# Patient Record
Sex: Male | Born: 1970 | Race: White | Hispanic: No | Marital: Married | Smoking: Current every day smoker
Health system: Southern US, Community
[De-identification: ages and names within clinical notes are randomized; demographics above are authoritative.]

## PROBLEM LIST (undated history)

## (undated) DIAGNOSIS — G4730 Sleep apnea, unspecified: Secondary | ICD-10-CM

## (undated) DIAGNOSIS — F32A Depression, unspecified: Secondary | ICD-10-CM

## (undated) DIAGNOSIS — K625 Hemorrhage of anus and rectum: Secondary | ICD-10-CM

## (undated) DIAGNOSIS — F419 Anxiety disorder, unspecified: Secondary | ICD-10-CM

## (undated) DIAGNOSIS — G8929 Other chronic pain: Secondary | ICD-10-CM

## (undated) DIAGNOSIS — K219 Gastro-esophageal reflux disease without esophagitis: Secondary | ICD-10-CM

## (undated) HISTORY — DX: Hemorrhage of anus and rectum: K62.5

## (undated) HISTORY — PX: PROSTATE BIOPSY: SHX241

---

## 2010-07-22 ENCOUNTER — Other Ambulatory Visit (HOSPITAL_COMMUNITY): Payer: Self-pay | Admitting: Neurosurgery

## 2010-07-22 DIAGNOSIS — M542 Cervicalgia: Secondary | ICD-10-CM

## 2010-07-22 DIAGNOSIS — M549 Dorsalgia, unspecified: Secondary | ICD-10-CM

## 2010-07-29 ENCOUNTER — Ambulatory Visit (HOSPITAL_COMMUNITY)
Admission: RE | Admit: 2010-07-29 | Discharge: 2010-07-29 | Disposition: A | Discharge status: Home / Self Care | Payer: BC Managed Care – PPO | Source: Ambulatory Visit | Attending: Neurosurgery | Admitting: Neurosurgery

## 2010-07-29 ENCOUNTER — Ambulatory Visit (HOSPITAL_COMMUNITY): Payer: Self-pay

## 2010-07-29 DIAGNOSIS — M79609 Pain in unspecified limb: Secondary | ICD-10-CM | POA: Insufficient documentation

## 2010-07-29 DIAGNOSIS — M545 Low back pain, unspecified: Secondary | ICD-10-CM | POA: Insufficient documentation

## 2010-07-29 DIAGNOSIS — M542 Cervicalgia: Secondary | ICD-10-CM | POA: Insufficient documentation

## 2010-07-29 DIAGNOSIS — M5126 Other intervertebral disc displacement, lumbar region: Secondary | ICD-10-CM | POA: Insufficient documentation

## 2010-07-29 DIAGNOSIS — M5020 Other cervical disc displacement, unspecified cervical region: Secondary | ICD-10-CM | POA: Insufficient documentation

## 2010-07-29 DIAGNOSIS — M549 Dorsalgia, unspecified: Secondary | ICD-10-CM

## 2010-10-18 ENCOUNTER — Emergency Department (HOSPITAL_COMMUNITY)
Admission: EM | Admit: 2010-10-18 | Discharge: 2010-10-18 | Disposition: A | Discharge status: Other Acute Inpatient Hospital | Payer: BC Managed Care – PPO | Attending: Emergency Medicine | Admitting: Emergency Medicine

## 2010-10-18 ENCOUNTER — Inpatient Hospital Stay (HOSPITAL_COMMUNITY)
Admission: RE | Admit: 2010-10-18 | Discharge: 2010-10-21 | DRG: 745 | Disposition: A | Discharge status: Home / Self Care | Payer: BC Managed Care – PPO | Source: Ambulatory Visit | Attending: Psychiatry | Admitting: Psychiatry

## 2010-10-18 DIAGNOSIS — G8929 Other chronic pain: Secondary | ICD-10-CM

## 2010-10-18 DIAGNOSIS — M25559 Pain in unspecified hip: Secondary | ICD-10-CM

## 2010-10-18 DIAGNOSIS — F1120 Opioid dependence, uncomplicated: Principal | ICD-10-CM

## 2010-10-18 DIAGNOSIS — F1110 Opioid abuse, uncomplicated: Secondary | ICD-10-CM | POA: Insufficient documentation

## 2010-10-18 DIAGNOSIS — F17200 Nicotine dependence, unspecified, uncomplicated: Secondary | ICD-10-CM

## 2010-10-18 DIAGNOSIS — Z6379 Other stressful life events affecting family and household: Secondary | ICD-10-CM

## 2010-10-18 DIAGNOSIS — M549 Dorsalgia, unspecified: Secondary | ICD-10-CM

## 2010-10-18 LAB — HEPATIC FUNCTION PANEL
ALT: 22 U/L (ref 0–53)
AST: 21 U/L (ref 0–37)
Alkaline Phosphatase: 72 U/L (ref 39–117)
Bilirubin, Direct: <0.1 mg/dL (ref 0.0–0.3)

## 2010-10-18 LAB — CBC
HCT: 41 % (ref 39.0–52.0)
MCV: 95.1 fL (ref 78.0–100.0)
RBC: 4.31 MIL/uL (ref 4.22–5.81)
WBC: 5.8 10*3/uL (ref 4.0–10.5)

## 2010-10-18 LAB — BASIC METABOLIC PANEL
Calcium: 9 mg/dL (ref 8.4–10.5)
GFR calc Af Amer: >60 mL/min (ref >60)
GFR calc non Af Amer: >60 mL/min (ref >60)
Glucose, Bld: 146 mg/dL — ABNORMAL HIGH (ref 70–99)
Potassium: 4 mEq/L (ref 3.5–5.1)
Sodium: 136 mEq/L (ref 135–145)

## 2010-10-18 LAB — ETHANOL: Alcohol, Ethyl (B): <5 mg/dL (ref 0–10)

## 2010-10-18 LAB — RAPID URINE DRUG SCREEN, HOSP PERFORMED
Barbiturates: NOT DETECTED
Benzodiazepines: NOT DETECTED
Cocaine: NOT DETECTED
Opiates: POSITIVE — AB

## 2010-10-18 LAB — DIFFERENTIAL
Lymphocytes Relative: 20 % (ref 12–46)
Lymphs Abs: 1.2 10*3/uL (ref 0.7–4.0)
Neutro Abs: 4 10*3/uL (ref 1.7–7.7)
Neutrophils Relative %: 69 % (ref 43–77)

## 2010-10-19 LAB — GLUCOSE, CAPILLARY: Glucose-Capillary: 122 mg/dL — ABNORMAL HIGH (ref 70–99)

## 2010-10-20 DIAGNOSIS — F1120 Opioid dependence, uncomplicated: Secondary | ICD-10-CM

## 2010-10-20 LAB — GLUCOSE, CAPILLARY
Glucose-Capillary: 131 mg/dL — ABNORMAL HIGH (ref 70–99)
Glucose-Capillary: 99 mg/dL (ref 70–99)

## 2010-10-21 LAB — GLUCOSE, CAPILLARY: Glucose-Capillary: 99 mg/dL (ref 70–99)

## 2010-10-23 NOTE — Discharge Summary (Signed)
  NAMEGILL, Francisco NO.:  [id number]  MEDICAL RECORD NO.:  [id number]           PATIENT TYPE:  I  LOCATION:  0305                          FACILITY:  [facility]  PHYSICIAN:  Anselm Jungling, MD  DATE OF BIRTH:  10-01-1970  DATE OF ADMISSION:  10/18/2010 DATE OF DISCHARGE:  10/21/2010                              DISCHARGE SUMMARY   IDENTIFYING DATA AND REASON FOR ADMISSION:  This was an inpatient psychiatric admission for Francisco [name], a 40 year old married male, who was admitted for treatment of opiate dependence.  Please refer to the admission note for further details pertaining to the symptoms, circumstances and history that led to his hospitalization.  He was given an initial Axis I diagnosis of opiate dependence.  MEDICAL AND LABORATORY:  The patient was medically and physically assessed by the psychiatric nurse practitioner.  His urine drug screen was positive for opiates.  His liver function profile was within normal limits.  He complained of chronic back and hip pain.  He was placed on a trial of Voltaren EC 75 mg b.i.d., Lidoderm 5% patch and Neurontin 600 mg t.i.d. to address these pain symptoms with reasonably good results. There were no other significant medical issues.  HOSPITAL COURSE:  The patient was admitted to the adult inpatient psychiatric service.  He presented as a well-nourished, normally- developed adult male who was generally pleasant and cooperative.  He participated in therapeutic groups and activities including those geared toward 12-step recovery.  He was placed on a clonidine withdrawal protocol.  After the third hospital day, he indicated he did not wish to take clonidine any further, and he requested discharge.  This was the case, even though it had been made clear to him that the detoxification process would be a 4- to 5-day admission.  Nonetheless, he persisted in requesting discharge. He insisted that it was not because of any desire  to return to using opiates.  The suicide risk assessment was completed, and he was rated at minimal risk.  He agreed to the following aftercare plan.  AFTERCARE:  The patient was to follow up at [facility] for their [facility] program.  Also, he was to attend 12-step meetings in the community, and it was recommended that he attend 990 in 90 days.  DISCHARGE MEDICATIONS: 1. Voltaren EC 75 mg b.i.d. 2. Lidoderm 5% patch to back daily. 3. Neurontin 600 mg t.i.d.  DISCHARGE DIAGNOSES:  Axis I:  Opiate dependence. Axis II:  Deferred. Axis III:  Chronic back and hip pain. Axis IV:  Stressors severe. Axis V:  Global Assessment of Functioning on discharge 50.  At the time of discharge, it was explained to the patient that because he had chosen not to stay for the full detoxification process, we would need to reconsider carefully whether we would accept him back for another round of detoxification treatment in the future if it were necessary.     Anselm Jungling, MD     SPB/MEDQ  D:  10/22/2010  T:  10/22/2010  Job:  [id number]  Electronically Signed by Geralyn Flash MD on 10/23/2010 09:03:14 AM
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
## 2011-06-17 ENCOUNTER — Encounter: Payer: Self-pay | Admitting: General Practice

## 2011-06-22 HISTORY — PX: HERNIA REPAIR: SHX51

## 2011-06-28 ENCOUNTER — Ambulatory Visit: Payer: BC Managed Care – PPO | Admitting: Gastroenterology

## 2011-07-01 ENCOUNTER — Ambulatory Visit: Payer: BC Managed Care – PPO | Admitting: Gastroenterology

## 2011-07-15 NOTE — H&P (Signed)
  NTS SOAP Note  Vital Signs:  Vitals as of: 07/08/2011: Systolic 127: Diastolic 84: Heart Rate 79: Temp 98.34F: Height 45ft 9in: Weight 196Lbs 0 Ounces: Pain Level 7: BMI 29  BMI : 28.94 kg/m2  Subjective: This 41 Years 72 Months old Male presents for of  HERNIA: ,Was found to have a right inguinal hernia by urologist.  Has been having right testicular pain over the past month.  Made worse with straining.  Slight bulge noted in right groin region.  Review of Symptoms:  Constitutional:unremarkable Head:unremarkable Eyes:unremarkable Nose/Mouth/Throat:unremarkable Cardiovascular:unremarkable Respiratory:unremarkable Gastrointestinabdominal pain,heartburn right testicular pain back pain Skin:unremarkable Hematolgic/Lymphatic:unremarkable Allergic/Immunologic:unremarkable   Past Medical History:Reviewed   Past Medical History  Surgical History: none Medical Problems: none Allergies: nkda Medications: none   Social History:Reviewed   Social History  Preferred Language: English ([place]) Race:  White Ethnicity: Not Hispanic / Latino Age: 41 Years 8 Months Marital Status:  M Alcohol:  No Recreational drug(s):  No   Smoking Status: Current every day smoker reviewed on 07/08/2011 Started Date: 06/21/1992 Packs per day: 1.00   Family History:Reviewed   Family History  Is there a family history ZO:[id number]    Objective Information: General:Well appearing, well nourished in no distress. Neck:Supple without lymphadenopathy.  Heart:RRR, no murmur or gallop.  Normal S1, S2.  No S3, S4.  Lungs:CTA bilaterally, no wheezes, rhonchi, rales.  Breathing unlabored. Abdomen:Soft, NT/ND, normal bowel sounds, no HSM, no masses.  No peritoneal signs.  Reducible right inguinal hernia noted. XB:[id number]  Assessment:Right inguinal hernia  Diagnosis &amp; Procedure: DiagnosisCode: 550.90,  ProcedureCode: 86578,    Plan:  Will need right inguinal herniorrhaphy.  Patient to call to schedule.   Patient Education:Alternative treatments to surgery were discussed with patient (and family).Risks and benefits  of procedure were fully explained to the patient (and family) who gave informed consent. Patient/family questions were addressed.  Follow-up:Pending Surgery

## 2011-07-26 ENCOUNTER — Encounter (HOSPITAL_COMMUNITY): Payer: Self-pay

## 2011-07-26 ENCOUNTER — Encounter (HOSPITAL_COMMUNITY)
Admission: RE | Admit: 2011-07-26 | Discharge: 2011-07-26 | Disposition: A | Discharge status: Home / Self Care | Payer: BC Managed Care – PPO | Source: Ambulatory Visit | Attending: General Surgery | Admitting: General Surgery

## 2011-07-26 ENCOUNTER — Encounter (HOSPITAL_COMMUNITY): Payer: Self-pay | Admitting: Pharmacy Technician

## 2011-07-26 HISTORY — DX: Gastro-esophageal reflux disease without esophagitis: K21.9

## 2011-07-26 HISTORY — DX: Other chronic pain: G89.29

## 2011-07-26 LAB — CBC
HCT: 40.6 % (ref 39.0–52.0)
Hemoglobin: 13.7 g/dL (ref 13.0–17.0)
MCH: 32.2 pg (ref 26.0–34.0)
MCHC: 33.7 g/dL (ref 30.0–36.0)
MCV: 95.5 fL (ref 78.0–100.0)
Platelets: 269 10*3/uL (ref 150–400)
RBC: 4.25 MIL/uL (ref 4.22–5.81)
RDW: 14.1 % (ref 11.5–15.5)
WBC: 7.1 10*3/uL (ref 4.0–10.5)

## 2011-07-26 LAB — DIFFERENTIAL
Basophils Absolute: 0 10*3/uL (ref 0.0–0.1)
Basophils Relative: 1 % (ref 0–1)
Eosinophils Absolute: 0.3 10*3/uL (ref 0.0–0.7)
Eosinophils Relative: 5 % (ref 0–5)
Lymphocytes Relative: 22 % (ref 12–46)
Lymphs Abs: 1.6 10*3/uL (ref 0.7–4.0)
Monocytes Absolute: 0.5 10*3/uL (ref 0.1–1.0)
Monocytes Relative: 7 % (ref 3–12)
Neutro Abs: 4.7 10*3/uL (ref 1.7–7.7)
Neutrophils Relative %: 66 % (ref 43–77)

## 2011-07-26 LAB — BASIC METABOLIC PANEL
BUN: 11 mg/dL (ref 6–23)
CO2: 25 mEq/L (ref 19–32)
Calcium: 9.8 mg/dL (ref 8.4–10.5)
Chloride: 103 mEq/L (ref 96–112)
Creatinine, Ser: 0.78 mg/dL (ref 0.50–1.35)
GFR calc Af Amer: >90 mL/min (ref >90)
GFR calc non Af Amer: >90 mL/min (ref >90)
Glucose, Bld: 127 mg/dL — ABNORMAL HIGH (ref 70–99)
Potassium: 4.5 mEq/L (ref 3.5–5.1)
Sodium: 137 mEq/L (ref 135–145)

## 2011-07-26 NOTE — Patient Instructions (Addendum)
20 Francisco [name]  07/26/2011   Your procedure is scheduled on:  07/30/2011  [facility] to [facility] at 06:15[facility].  Call this number if you have problems the morning of surgery: [phone]   Remember:   Do not eat food:After Midnight.  May have clear liquids:until Midnight .  Clear liquids include soda, tea, black coffee, apple or grape juice, broth.  Take these medicines the morning of surgery with A SIP OF WATER: Take your Oxycodone only if needed.   Do not wear jewelry, make-up or nail polish.  Do not wear lotions, powders, or perfumes. You may wear deodorant.  Do not shave 48 hours prior to surgery.  Do not bring valuables to the hospital.  Contacts, dentures or bridgework may not be worn into surgery.  Leave suitcase in the car. After surgery it may be brought to your room.  For patients admitted to the hospital, checkout time is 11:00 AM the day of discharge.   Patients discharged the day of surgery will not be allowed to drive home.  Name and phone number of your driver: Family  Special Instructions: CHG Shower Use Special Wash: 1/2 bottle night before surgery and 1/2 bottle morning of surgery.   Please read over the following fact sheets that you were given: Pain Booklet, MRSA Information, Surgical Site Infection Prevention, Anesthesia Post-op Instructions and Care and Recovery After Surgery     Hernia Repair Care After These instructions give you information on caring for yourself after your procedure. Your doctor may also give you more specific instructions. Call your doctor if you have any problems or questions after your procedure. HOME CARE   You may have changes in your poops (bowel movements).   You may have loose or watery poop (diarrhea).   You may be not able to poop.   Your bowels will slowly get back to normal.   Do not eat any food that makes you sick to your stomach (nauseous). Eat small meals 4 to 6 times a day instead of 3 large ones.   Do not drink pop. It  will give you gas.   Do not drink alcohol.   Do not lift anything heavier than 10 pounds. This is about the weight of a gallon of milk.   Do not do anything that makes you very tired for at least 6 weeks.   Do not get your wound wet for 2 days.   You may take a sponge bath during this time.   After 2 days you may take a shower. Gently pat your surgical cut (incision) dry with a towel. Do not rub it.   For men: You may have been given an athletic supporter (scrotal support) before you left the hospital. It holds your scrotum and testicles closer to your body so there is no strain on your wound. Wear the supporter until your doctor tells you that you do not need it anymore.  GET HELP RIGHT AWAY IF:  You have watery poop, or cannot poop for more than 3 days.   You feel sick to your stomach or throw up (vomit) more than 2 or 3 times.   You have temperature by mouth above 102 F (38.9 C).   You see redness or puffiness (swelling) around your wound.   You see yellowish Conlan Miceli fluid (pus) coming from your wound.   You see a bulge or bump in your lower belly (abdomen) or near your groin.   You develop a rash, trouble breathing, or  any other symptoms from medicines taken.  MAKE SURE YOU:  Understand these instructions.   Will watch your condition.   Will get help right away if your are not doing well or get worse.  Document Released: 05/20/2008 Document Revised: 02/17/2011 Document Reviewed: 05/20/2008 [facility] Patient Information 2012 [place], [place].    PATIENT INSTRUCTIONS POST-ANESTHESIA  IMMEDIATELY FOLLOWING SURGERY:  Do not drive or operate machinery for the first twenty four hours after surgery.  Do not make any important decisions for twenty four hours after surgery or while taking narcotic pain medications or sedatives.  If you develop intractable nausea and vomiting or a severe headache please notify your doctor immediately.  FOLLOW-UP:  Please make an appointment  with your surgeon as instructed. You do not need to follow up with anesthesia unless specifically instructed to do so.  WOUND CARE INSTRUCTIONS (if applicable):  Keep a dry clean dressing on the anesthesia/puncture wound site if there is drainage.  Once the wound has quit draining you may leave it open to air.  Generally you should leave the bandage intact for twenty four hours unless there is drainage.  If the epidural site drains for more than 36-48 hours please call the anesthesia department.  QUESTIONS?:  Please feel free to call your physician or the hospital operator if you have any questions, and they will be happy to assist you.     [facility] Anesthesia Department [address] [place] [phone]

## 2011-07-30 ENCOUNTER — Encounter (HOSPITAL_COMMUNITY): Payer: Self-pay | Admitting: *Deleted

## 2011-07-30 ENCOUNTER — Encounter (HOSPITAL_COMMUNITY): Payer: Self-pay | Admitting: Anesthesiology

## 2011-07-30 ENCOUNTER — Ambulatory Visit (HOSPITAL_COMMUNITY)
Admission: RE | Admit: 2011-07-30 | Discharge: 2011-07-30 | Disposition: A | Discharge status: Home / Self Care | Payer: BC Managed Care – PPO | Source: Ambulatory Visit | Attending: General Surgery | Admitting: General Surgery

## 2011-07-30 ENCOUNTER — Encounter (HOSPITAL_COMMUNITY)
Admission: RE | Disposition: A | Discharge status: Home / Self Care | Payer: Self-pay | Source: Ambulatory Visit | Attending: General Surgery

## 2011-07-30 ENCOUNTER — Inpatient Hospital Stay (HOSPITAL_COMMUNITY): Payer: BC Managed Care – PPO | Admitting: Anesthesiology

## 2011-07-30 DIAGNOSIS — K4090 Unilateral inguinal hernia, without obstruction or gangrene, not specified as recurrent: Secondary | ICD-10-CM | POA: Insufficient documentation

## 2011-07-30 DIAGNOSIS — Z01812 Encounter for preprocedural laboratory examination: Secondary | ICD-10-CM | POA: Insufficient documentation

## 2011-07-30 HISTORY — PX: INGUINAL HERNIA REPAIR: SHX194

## 2011-07-30 SURGERY — REPAIR, HERNIA, INGUINAL, ADULT
Anesthesia: General | Site: Groin | Laterality: Right | Wound class: Clean

## 2011-07-30 MED ORDER — GLYCOPYRROLATE 0.2 MG/ML IJ SOLN
INTRAMUSCULAR | Status: AC
  Filled 2011-07-30: qty 1

## 2011-07-30 MED ORDER — OXYCODONE-ACETAMINOPHEN 10-325 MG PO TABS: 1.0000 | ORAL_TABLET | ORAL | Status: DC | PRN

## 2011-07-30 MED ORDER — MEPERIDINE HCL 50 MG/ML IJ SOLN
INTRAMUSCULAR | Status: AC
  Administered 2011-07-30: 12.5 mg
  Filled 2011-07-30: qty 1

## 2011-07-30 MED ORDER — ONDANSETRON HCL 4 MG/2ML IJ SOLN: 4.0000 mg | Freq: Once | INTRAMUSCULAR | Status: DC | PRN

## 2011-07-30 MED ORDER — FENTANYL CITRATE 0.05 MG/ML IJ SOLN
INTRAMUSCULAR | Status: AC
  Filled 2011-07-30: qty 2

## 2011-07-30 MED ORDER — BUPIVACAINE HCL (PF) 0.5 % IJ SOLN
INTRAMUSCULAR | Status: AC
  Filled 2011-07-30: qty 30

## 2011-07-30 MED ORDER — ACETAMINOPHEN 325 MG PO TABS: 325.0000 mg | ORAL_TABLET | ORAL | Status: DC | PRN

## 2011-07-30 MED ORDER — FENTANYL CITRATE 0.05 MG/ML IJ SOLN
25.0000 ug | INTRAMUSCULAR | Status: DC | PRN
  Administered 2011-07-30 (×2): 50 ug via INTRAVENOUS

## 2011-07-30 MED ORDER — LIDOCAINE HCL 1 % IJ SOLN
INTRAMUSCULAR | Status: DC | PRN
  Administered 2011-07-30: 50 mg via INTRADERMAL

## 2011-07-30 MED ORDER — CEFAZOLIN SODIUM 1-5 GM-% IV SOLN
INTRAVENOUS | Status: DC | PRN
  Administered 2011-07-30: 2 g via INTRAVENOUS

## 2011-07-30 MED ORDER — ENOXAPARIN SODIUM 40 MG/0.4ML ~~LOC~~ SOLN
40.0000 mg | Freq: Once | SUBCUTANEOUS | Status: AC
  Administered 2011-07-30: 40 mg via SUBCUTANEOUS

## 2011-07-30 MED ORDER — BUPIVACAINE HCL (PF) 0.5 % IJ SOLN
INTRAMUSCULAR | Status: DC | PRN
  Administered 2011-07-30: 8 mL

## 2011-07-30 MED ORDER — ONDANSETRON HCL 4 MG/2ML IJ SOLN
INTRAMUSCULAR | Status: AC
  Administered 2011-07-30: 4 mg via INTRAVENOUS
  Filled 2011-07-30: qty 2

## 2011-07-30 MED ORDER — PROPOFOL 10 MG/ML IV EMUL
INTRAVENOUS | Status: DC
Start: 2011-07-30 — End: 2011-07-30
  Filled 2011-07-30: qty 40

## 2011-07-30 MED ORDER — MIDAZOLAM HCL 2 MG/2ML IJ SOLN
INTRAMUSCULAR | Status: AC
  Administered 2011-07-30: 2 mg via INTRAVENOUS
  Filled 2011-07-30: qty 2

## 2011-07-30 MED ORDER — MEPERIDINE HCL 25 MG/ML IJ SOLN: 6.2500 mg | INTRAMUSCULAR | Status: DC | PRN

## 2011-07-30 MED ORDER — CEFAZOLIN SODIUM-DEXTROSE 2-3 GM-% IV SOLR
INTRAVENOUS | Status: AC
  Filled 2011-07-30: qty 50

## 2011-07-30 MED ORDER — PROPOFOL 10 MG/ML IV EMUL
INTRAVENOUS | Status: AC
  Filled 2011-07-30: qty 20

## 2011-07-30 MED ORDER — FENTANYL CITRATE 0.05 MG/ML IJ SOLN: 25.0000 ug | INTRAMUSCULAR | Status: DC | PRN

## 2011-07-30 MED ORDER — SODIUM CHLORIDE 0.9 % IR SOLN
Status: DC | PRN
  Administered 2011-07-30: 1000 mL

## 2011-07-30 MED ORDER — GLYCOPYRROLATE 0.2 MG/ML IJ SOLN
0.2000 mg | Freq: Once | INTRAMUSCULAR | Status: AC
  Administered 2011-07-30: 0.2 mg via INTRAVENOUS

## 2011-07-30 MED ORDER — LIDOCAINE HCL (PF) 1 % IJ SOLN
INTRAMUSCULAR | Status: AC
  Filled 2011-07-30: qty 5

## 2011-07-30 MED ORDER — FENTANYL CITRATE 0.05 MG/ML IJ SOLN
INTRAMUSCULAR | Status: DC | PRN
  Administered 2011-07-30: 100 ug via INTRAVENOUS
  Administered 2011-07-30: 50 ug via INTRAVENOUS
  Administered 2011-07-30 (×2): 25 ug via INTRAVENOUS

## 2011-07-30 MED ORDER — MIDAZOLAM HCL 2 MG/2ML IJ SOLN
1.0000 mg | INTRAMUSCULAR | Status: DC | PRN
  Administered 2011-07-30: 2 mg via INTRAVENOUS

## 2011-07-30 MED ORDER — FENTANYL CITRATE 0.05 MG/ML IJ SOLN
INTRAMUSCULAR | Status: AC
  Administered 2011-07-30: 50 ug via INTRAVENOUS
  Filled 2011-07-30: qty 2

## 2011-07-30 MED ORDER — MIDAZOLAM HCL 5 MG/5ML IJ SOLN
INTRAMUSCULAR | Status: DC | PRN
  Administered 2011-07-30 (×2): 2 mg via INTRAVENOUS

## 2011-07-30 MED ORDER — ENOXAPARIN SODIUM 40 MG/0.4ML ~~LOC~~ SOLN
SUBCUTANEOUS | Status: AC
  Administered 2011-07-30: 40 mg via SUBCUTANEOUS
  Filled 2011-07-30: qty 0.4

## 2011-07-30 MED ORDER — LACTATED RINGERS IV SOLN
INTRAVENOUS | Status: DC
  Administered 2011-07-30: 08:00:00 via INTRAVENOUS

## 2011-07-30 MED ORDER — CEFAZOLIN SODIUM-DEXTROSE 2-3 GM-% IV SOLR: 2.0000 g | INTRAVENOUS | Status: DC

## 2011-07-30 MED ORDER — KETOROLAC TROMETHAMINE 30 MG/ML IJ SOLN: 30.0000 mg | Freq: Once | INTRAMUSCULAR | Status: DC

## 2011-07-30 MED ORDER — PROPOFOL 10 MG/ML IV BOLUS
INTRAVENOUS | Status: DC | PRN
  Administered 2011-07-30: 160 mg via INTRAVENOUS

## 2011-07-30 MED ORDER — MIDAZOLAM HCL 2 MG/2ML IJ SOLN
INTRAMUSCULAR | Status: AC
  Filled 2011-07-30: qty 2

## 2011-07-30 MED ORDER — MEPERIDINE HCL 25 MG/ML IJ SOLN: 12.5000 mg | Freq: Once | INTRAMUSCULAR | Status: DC

## 2011-07-30 MED ORDER — LIDOCAINE HCL (PF) 1 % IJ SOLN
INTRAMUSCULAR | Status: AC
  Filled 2011-07-30: qty 10

## 2011-07-30 MED ORDER — ONDANSETRON HCL 4 MG/2ML IJ SOLN
4.0000 mg | Freq: Once | INTRAMUSCULAR | Status: AC
  Administered 2011-07-30: 4 mg via INTRAVENOUS

## 2011-07-30 SURGICAL SUPPLY — 38 items
BAG HAMPER (MISCELLANEOUS) ×2 IMPLANT
CLOTH BEACON ORANGE TIMEOUT ST (SAFETY) ×2 IMPLANT
COVER LIGHT HANDLE STERIS (MISCELLANEOUS) ×4 IMPLANT
DECANTER SPIKE VIAL GLASS SM (MISCELLANEOUS) IMPLANT
DERMABOND ADVANCED (GAUZE/BANDAGES/DRESSINGS) ×1
DERMABOND ADVANCED .7 DNX12 (GAUZE/BANDAGES/DRESSINGS) ×1 IMPLANT
DRAIN PENROSE 18X.75 LTX STRL (MISCELLANEOUS) ×2 IMPLANT
ELECT REM PT RETURN 9FT ADLT (ELECTROSURGICAL) ×2
ELECTRODE REM PT RTRN 9FT ADLT (ELECTROSURGICAL) ×1 IMPLANT
FORMALIN 10 PREFIL 120ML (MISCELLANEOUS) IMPLANT
GLOVE BIO SURGEON STRL SZ7.5 (GLOVE) ×2 IMPLANT
GLOVE BIOGEL PI IND STRL 7.0 (GLOVE) ×3 IMPLANT
GLOVE BIOGEL PI INDICATOR 7.0 (GLOVE) ×3
GLOVE ECLIPSE 6.5 STRL STRAW (GLOVE) ×6 IMPLANT
GOWN STRL REIN XL XLG (GOWN DISPOSABLE) ×8 IMPLANT
INST SET MINOR GENERAL (KITS) ×2 IMPLANT
KIT ROOM TURNOVER APOR (KITS) ×2 IMPLANT
MANIFOLD NEPTUNE II (INSTRUMENTS) ×2 IMPLANT
MESH MARLEX PLUG MEDIUM (Mesh General) ×2 IMPLANT
NEEDLE HYPO 25X1 1.5 SAFETY (NEEDLE) ×2 IMPLANT
NS IRRIG 1000ML POUR BTL (IV SOLUTION) ×2 IMPLANT
PACK MINOR (CUSTOM PROCEDURE TRAY) ×2 IMPLANT
PAD ARMBOARD 7.5X6 YLW CONV (MISCELLANEOUS) ×2 IMPLANT
PAIN PUMP ON-Q 100MLX2ML 2.5IN (PAIN MANAGEMENT) IMPLANT
SET BASIN LINEN APH (SET/KITS/TRAYS/PACK) ×2 IMPLANT
SOL PREP PROV IODINE SCRUB 4OZ (MISCELLANEOUS) ×2 IMPLANT
SUT NOVA NAB GS-22 2 2-0 T-19 (SUTURE) ×6 IMPLANT
SUT NOVAFIL NAB HGS22 2-0 30IN (SUTURE) IMPLANT
SUT SILK 3 0 (SUTURE)
SUT SILK 3-0 18XBRD TIE 12 (SUTURE) IMPLANT
SUT VIC AB 2-0 CT1 27 (SUTURE) ×1
SUT VIC AB 2-0 CT1 TAPERPNT 27 (SUTURE) ×1 IMPLANT
SUT VIC AB 3-0 SH 27 (SUTURE) ×1
SUT VIC AB 3-0 SH 27X BRD (SUTURE) ×1 IMPLANT
SUT VIC AB 4-0 PS2 27 (SUTURE) ×2 IMPLANT
SUT VICRYL AB 3 0 TIES (SUTURE) IMPLANT
SYR CONTROL 10ML LL (SYRINGE) ×2 IMPLANT
TOWEL OR 17X26 4PK STRL BLUE (TOWEL DISPOSABLE) IMPLANT

## 2011-07-30 NOTE — Op Note (Signed)
Patient:  Francisco [name]  DOB:  01-Apr-1971  MRN:  [id number]   Preop Diagnosis:  Right inguinal hernia  Postop Diagnosis:  Same, indirect  Procedure:  Right inguinal herniorrhaphy  Surgeon:  Franky Macho, M.D.  Anes:  General  Indications:  Patient is a 41 year old white male presents with a symptomatic right anal hernia. The risks and benefits of the procedure including bleeding, infection, and recurrence of the hernia were fully explained to the patient, gave informed consent.  Procedure note:  Patient is placed the supine position. After general anesthesia was measured, the right groin region was prepped and draped using usual sterile technique with Betadine. Surgical site confirmation was performed.  A transverse incision was made in the right groin region down to the external oblique aponeurosis. The aponeurosis was incised to the external ring. A Penrose drain was placed around the spermatic cord. The vas deferens was within the spermatic cord. The ilioinguinal nerve was identified and retracted inferiorly from the operative field. An indirect hernia sac was found. This was freed away from the spermatic cord up to the peritoneal reflection and inverted. A medium size Bard prefix plug was then inserted. An onlay patch was then placed along the floor of inguinal canal and secured superiorly to the conjoined tendon and inferiorly to the shelving edge of Poupart's ligament using 2-0 Novafil interrupted sutures. The internal ring was recreated using a 2-0 Novafil suture. The external oblique aponeuroses was reapproximated using a 2-0 Vicryl running suture. The subcutaneous layer was reapproximated using 3-0 Vicryl interrupted suture. The skin was closed using a 4-0 Vicryl subcuticular suture. 0.5% Sensorcaine was instilled the surrounding wound. Dermabond was then applied.  All tape and needle counts were correct the end of the procedure. Patient was awakened and transferred PACU in stable  condition.  Complications:  None  EBL:  Minimal  Specimen:  None

## 2011-07-30 NOTE — Anesthesia Procedure Notes (Signed)
Procedure Name: LMA Insertion Performed by: Despina Hidden Pre-anesthesia Checklist: Suction available, Emergency Drugs available, Patient identified and Patient being monitored Patient Re-evaluated:Patient Re-evaluated prior to inductionOxygen Delivery Method: Circle System Utilized Preoxygenation: Pre-oxygenation with 100% oxygen Intubation Type: IV induction Ventilation: Mask ventilation without difficulty LMA: LMA inserted LMA Size: 4.0 Number of attempts: 1 Tube secured with: Tape Dental Injury: Teeth and Oropharynx as per pre-operative assessment

## 2011-07-30 NOTE — Interval H&P Note (Signed)
History and Physical Interval Note:  07/30/2011 7:26 AM  Francisco [name]  has presented today for surgery, with the diagnosis of Inguinal hernia without mention of obstruction or gangrene, unilateral or unspecified, not specified as recurrent  The various methods of treatment have been discussed with the patient and family. After consideration of risks, benefits and other options for treatment, the patient has consented to  Procedure(s): HERNIA REPAIR INGUINAL ADULT as a surgical intervention .  The patients' history has been reviewed, patient examined, no change in status, stable for surgery.  I have reviewed the patients' chart and labs.  Questions were answered to the patient's satisfaction.     Franky Macho A

## 2011-07-30 NOTE — Transfer of Care (Signed)
Immediate Anesthesia Transfer of Care Note  Patient: Francisco [name]  Procedure(s) Performed:  HERNIA REPAIR INGUINAL ADULT  Patient Location: PACU  Anesthesia Type: General  Level of Consciousness: awake, alert  and patient cooperative  Airway & Oxygen Therapy: Patient Spontanous Breathing and Patient connected to face mask oxygen  Post-op Assessment: Report given to PACU RN, Post -op Vital signs reviewed and stable and Patient moving all extremities X 4  Post vital signs: Reviewed and stable  Complications: No apparent anesthesia complications

## 2011-07-30 NOTE — Addendum Note (Signed)
Addendum  created 07/30/11 1610 by Roselie Awkward, MD   Modules edited:Orders, PRL Based Order Sets

## 2011-07-30 NOTE — Anesthesia Postprocedure Evaluation (Signed)
  Anesthesia Post-op Note  Patient: Francisco [name]  Procedure(s) Performed:  HERNIA REPAIR INGUINAL ADULT  Patient Location: PACU  Anesthesia Type: General  Level of Consciousness: awake, alert , oriented and patient cooperative  Airway and Oxygen Therapy: Patient Spontanous Breathing  Post-op Pain: 4 /10, moderate  Post-op Assessment: Post-op Vital signs reviewed, Patient's Cardiovascular Status Stable, Respiratory Function Stable, Patent Airway and No signs of Nausea or vomiting  Post-op Vital Signs: Reviewed and stable  Complications: No apparent anesthesia complications

## 2011-07-30 NOTE — Anesthesia Preprocedure Evaluation (Signed)
Anesthesia Evaluation  Patient identified by MRN, date of birth, ID band Patient awake    Reviewed: Allergy & Precautions, H&P , NPO status , Patient's Chart, lab work & pertinent test results  Airway Mallampati: I TM Distance: >3 FB Neck ROM: Full    Dental No notable dental hx.    Pulmonary neg pulmonary ROS,    Pulmonary exam normal       Cardiovascular neg cardio ROS Regular Normal    Neuro/Psych Negative Neurological ROS  Negative Psych ROS   GI/Hepatic Neg liver ROS, GERD-  ,  Endo/Other  Negative Endocrine ROS  Renal/GU negative Renal ROS  Genitourinary negative   Musculoskeletal   Abdominal Normal abdominal exam  (+)   Peds  Hematology negative hematology ROS (+)   Anesthesia Other Findings   Reproductive/Obstetrics                           Anesthesia Physical Anesthesia Plan  ASA: II  Anesthesia Plan: General   Post-op Pain Management:    Induction: Intravenous  Airway Management Planned: LMA  Additional Equipment:   Intra-op Plan:   Post-operative Plan: Extubation in OR  Informed Consent: I have reviewed the patients History and Physical, chart, labs and discussed the procedure including the risks, benefits and alternatives for the proposed anesthesia with the patient or authorized representative who has indicated his/her understanding and acceptance.     Plan Discussed with: CRNA  Anesthesia Plan Comments:         Anesthesia Quick Evaluation

## 2011-08-02 ENCOUNTER — Encounter (HOSPITAL_COMMUNITY): Payer: Self-pay | Admitting: General Surgery

## 2015-10-01 DIAGNOSIS — M5030 Other cervical disc degeneration, unspecified cervical region: Secondary | ICD-10-CM | POA: Diagnosis not present

## 2015-12-26 DIAGNOSIS — J029 Acute pharyngitis, unspecified: Secondary | ICD-10-CM | POA: Diagnosis not present

## 2015-12-26 DIAGNOSIS — R05 Cough: Secondary | ICD-10-CM | POA: Diagnosis not present

## 2016-02-11 DIAGNOSIS — T1502XA Foreign body in cornea, left eye, initial encounter: Secondary | ICD-10-CM | POA: Diagnosis not present

## 2016-02-11 DIAGNOSIS — S0502XA Injury of conjunctiva and corneal abrasion without foreign body, left eye, initial encounter: Secondary | ICD-10-CM | POA: Diagnosis not present

## 2016-02-26 DIAGNOSIS — Z720 Tobacco use: Secondary | ICD-10-CM | POA: Diagnosis not present

## 2016-02-26 DIAGNOSIS — G894 Chronic pain syndrome: Secondary | ICD-10-CM | POA: Diagnosis not present

## 2016-02-26 DIAGNOSIS — Z6830 Body mass index (BMI) 30.0-30.9, adult: Secondary | ICD-10-CM | POA: Diagnosis not present

## 2016-02-26 DIAGNOSIS — F329 Major depressive disorder, single episode, unspecified: Secondary | ICD-10-CM | POA: Diagnosis not present

## 2016-11-23 DIAGNOSIS — T1501XA Foreign body in cornea, right eye, initial encounter: Secondary | ICD-10-CM | POA: Diagnosis not present

## 2016-11-23 DIAGNOSIS — H578 Other specified disorders of eye and adnexa: Secondary | ICD-10-CM | POA: Diagnosis not present

## 2016-11-23 DIAGNOSIS — S0501XA Injury of conjunctiva and corneal abrasion without foreign body, right eye, initial encounter: Secondary | ICD-10-CM | POA: Diagnosis not present

## 2016-11-25 DIAGNOSIS — H578 Other specified disorders of eye and adnexa: Secondary | ICD-10-CM | POA: Diagnosis not present

## 2017-01-27 DIAGNOSIS — J039 Acute tonsillitis, unspecified: Secondary | ICD-10-CM | POA: Diagnosis not present

## 2017-01-27 DIAGNOSIS — R590 Localized enlarged lymph nodes: Secondary | ICD-10-CM | POA: Diagnosis not present

## 2017-01-27 DIAGNOSIS — L299 Pruritus, unspecified: Secondary | ICD-10-CM | POA: Diagnosis not present

## 2017-01-27 DIAGNOSIS — K429 Umbilical hernia without obstruction or gangrene: Secondary | ICD-10-CM | POA: Diagnosis not present

## 2017-02-24 DIAGNOSIS — K625 Hemorrhage of anus and rectum: Secondary | ICD-10-CM | POA: Diagnosis not present

## 2017-02-24 DIAGNOSIS — Z6834 Body mass index (BMI) 34.0-34.9, adult: Secondary | ICD-10-CM | POA: Diagnosis not present

## 2017-02-28 DIAGNOSIS — K589 Irritable bowel syndrome without diarrhea: Secondary | ICD-10-CM | POA: Diagnosis not present

## 2017-02-28 DIAGNOSIS — K625 Hemorrhage of anus and rectum: Secondary | ICD-10-CM | POA: Diagnosis not present

## 2017-02-28 DIAGNOSIS — Z86010 Personal history of colonic polyps: Secondary | ICD-10-CM | POA: Diagnosis not present

## 2017-03-03 ENCOUNTER — Encounter (INDEPENDENT_AMBULATORY_CARE_PROVIDER_SITE_OTHER): Payer: Self-pay | Admitting: Internal Medicine

## 2017-03-09 ENCOUNTER — Encounter (INDEPENDENT_AMBULATORY_CARE_PROVIDER_SITE_OTHER): Payer: Self-pay

## 2017-03-09 ENCOUNTER — Ambulatory Visit (INDEPENDENT_AMBULATORY_CARE_PROVIDER_SITE_OTHER): Payer: BLUE CROSS/BLUE SHIELD | Admitting: Internal Medicine

## 2017-03-09 ENCOUNTER — Encounter (INDEPENDENT_AMBULATORY_CARE_PROVIDER_SITE_OTHER): Payer: Self-pay | Admitting: Internal Medicine

## 2017-03-09 DIAGNOSIS — K625 Hemorrhage of anus and rectum: Secondary | ICD-10-CM | POA: Diagnosis not present

## 2017-03-09 DIAGNOSIS — K219 Gastro-esophageal reflux disease without esophagitis: Secondary | ICD-10-CM

## 2017-03-09 HISTORY — DX: Gastro-esophageal reflux disease without esophagitis: K21.9

## 2017-03-09 HISTORY — DX: Hemorrhage of anus and rectum: K62.5

## 2017-03-09 MED ORDER — HYDROCORTISONE ACE-PRAMOXINE 1-1 % RE FOAM: 1.0000 | Freq: Two times a day (BID) | RECTAL | 0 refills | Status: DC
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
## 2017-03-09 NOTE — Patient Instructions (Signed)
Will discuss with Dr. Karilyn Cota, Possible flex. Sigmoid.

## 2017-03-28 ENCOUNTER — Other Ambulatory Visit (INDEPENDENT_AMBULATORY_CARE_PROVIDER_SITE_OTHER): Payer: Self-pay | Admitting: Internal Medicine

## 2017-03-28 DIAGNOSIS — K625 Hemorrhage of anus and rectum: Secondary | ICD-10-CM
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
## 2017-04-22 ENCOUNTER — Ambulatory Visit (INDEPENDENT_AMBULATORY_CARE_PROVIDER_SITE_OTHER): Payer: BLUE CROSS/BLUE SHIELD | Admitting: Family Medicine

## 2017-04-22 ENCOUNTER — Encounter: Payer: Self-pay | Admitting: Family Medicine

## 2017-04-22 VITALS — BP 124/83 | HR 87 | Temp 97.4°F | Ht 69.0 in | Wt 229.0 lb

## 2017-04-22 DIAGNOSIS — Z7689 Persons encountering health services in other specified circumstances: Secondary | ICD-10-CM

## 2017-04-22 DIAGNOSIS — K429 Umbilical hernia without obstruction or gangrene: Secondary | ICD-10-CM | POA: Diagnosis not present

## 2017-04-22 DIAGNOSIS — F17200 Nicotine dependence, unspecified, uncomplicated: Secondary | ICD-10-CM

## 2017-04-22 DIAGNOSIS — G8929 Other chronic pain: Secondary | ICD-10-CM

## 2017-04-22 DIAGNOSIS — M542 Cervicalgia: Secondary | ICD-10-CM | POA: Diagnosis not present

## 2017-04-22 MED ORDER — DICLOFENAC SODIUM 75 MG PO TBEC: 75.0000 mg | DELAYED_RELEASE_TABLET | Freq: Two times a day (BID) | ORAL | 0 refills | Status: DC

## 2017-04-22 NOTE — Assessment & Plan Note (Signed)
Contemplative.  We will continue to offer smoking cessation tools and counseling with each appointment.

## 2017-04-22 NOTE — Assessment & Plan Note (Signed)
Given the fact that it is enlarging and has been intermittently uncomfortable, I did highly recommend he seek surgical intervention for this.  A referral has been placed to general surgery for evaluation and consideration of mesh placement.  Strict return precautions and reasons for emergent evaluation in the emergency department were reviewed with the patient.  He voiced good understanding and will follow-up as needed.

## 2017-04-22 NOTE — Patient Instructions (Signed)
I have sent in Voltaren for you to take twice a day as needed for neck pain.  Make sure that you take this with a meal.  Drink plenty of fluid while taking this medicine.  Do not take any Aleve, naproxen, ibuprofen, Motrin, Advil while taking this medicine.  Tylenol is okay to continue if needed.  Umbilical Hernia, Adult A hernia is a bulge of tissue that pushes through an opening between muscles. An umbilical hernia happens in the abdomen, near the belly button (umbilicus). The hernia may contain tissues from the small intestine, large intestine, or fatty tissue covering the intestines (omentum). Umbilical hernias in adults tend to get worse over time, and they require surgical treatment. There are several types of umbilical hernias. You may have:  A hernia located just above or below the umbilicus (indirect hernia). This is the most common type of umbilical hernia in adults.  A hernia that forms through an opening formed by the umbilicus (direct hernia).  A hernia that comes and goes (reducible hernia). A reducible hernia may be visible only when you strain, lift something heavy, or cough. This type of hernia can be pushed back into the abdomen (reduced).  A hernia that traps abdominal tissue inside the hernia (incarcerated hernia). This type of hernia cannot be reduced.  A hernia that cuts off blood flow to the tissues inside the hernia (strangulated hernia). The tissues can start to die if this happens. This type of hernia requires emergency treatment.  What are the causes? An umbilical hernia happens when tissue inside the abdomen presses on a weak area of the abdominal muscles. What increases the risk? You may have a greater risk of this condition if you:  Are obese.  Have had several pregnancies.  Have a buildup of fluid inside your abdomen (ascites).  Have had surgery that weakens the abdominal muscles.  What are the signs or symptoms? The main symptom of this condition is a  painless bulge at or near the belly button. A reducible hernia may be visible only when you strain, lift something heavy, or cough. Other symptoms may include:  Dull pain.  A feeling of pressure.  Symptoms of a strangulated hernia may include:  Pain that gets increasingly worse.  Nausea and vomiting.  Pain when pressing on the hernia.  Skin over the hernia becoming red or purple.  Constipation.  Blood in the stool.  How is this diagnosed? This condition may be diagnosed based on:  A physical exam. You may be asked to cough or strain while standing. These actions increase the pressure inside your abdomen and force the hernia through the opening in your muscles. Your health care provider may try to reduce the hernia by pressing on it.  Your symptoms and medical history.  How is this treated? Surgery is the only treatment for an umbilical hernia. Surgery for a strangulated hernia is done as soon as possible. If you have a small hernia that is not incarcerated, you may need to lose weight before having surgery. Follow these instructions at home:  Lose weight, if told by your health care provider.  Do not try to push the hernia back in.  Watch your hernia for any changes in color or size. Tell your health care provider if any changes occur.  You may need to avoid activities that increase pressure on your hernia.  Do not lift anything that is heavier than 10 lb (4.5 kg) until your health care provider says that this is  safe.  Take over-the-counter and prescription medicines only as told by your health care provider.  Keep all follow-up visits as told by your health care provider. This is important. Contact a health care provider if:  Your hernia gets larger.  Your hernia becomes painful. Get help right away if:  You develop sudden, severe pain near the area of your hernia.  You have pain as well as nausea or vomiting.  You have pain and the skin over your hernia  changes color.  You develop a fever. This information is not intended to replace advice given to you by your health care provider. Make sure you discuss any questions you have with your health care provider. Document Released: 11/07/2015 Document Revised: 02/08/2016 Document Reviewed: 11/07/2015 Elsevier Interactive Patient Education  [employer].

## 2017-04-22 NOTE — Assessment & Plan Note (Addendum)
Asymmetric fat pad distribution appreciated.  He does have mild pain with extension of C-spine.  His pain is refractory to muscle relaxers and OTC NSAID.  I have prescribed him Voltaren to take twice daily as needed pain.  It was clarified that his GI bleed was related to internal hemorrhoids and he has no history of upper GI bleed or ulceration.  Given his report of degenerative changes on plain films with previous provider, I have placed a referral to orthopedics for further evaluation and management.  He would like to avoid surgery if possible, I did inform him of that he may be a candidate for injections to the C-spine but that that would be determined by the orthopedist.  No red flag signs.  Follow-up as needed.

## 2017-04-27 ENCOUNTER — Other Ambulatory Visit: Payer: Self-pay | Admitting: Family Medicine

## 2017-04-27 DIAGNOSIS — F17200 Nicotine dependence, unspecified, uncomplicated: Secondary | ICD-10-CM

## 2017-04-27 MED ORDER — VARENICLINE TARTRATE 0.5 MG X 11 & 1 MG X 42 PO MISC: ORAL | 0 refills | Status: DC

## 2017-04-27 NOTE — Progress Notes (Signed)
Patient's wife was actually seen for physical exam today.  She notes that her has been asked about starting Chantix again for smoking cessation.  He is currently a 1 pack/day smoker.  He has quit previously with Chantix in the past and is aware of the side effects of the medications.  He will follow-up in 1 month for smoking cessation and continuation pack.  Meds ordered this encounter  Medications  . varenicline (CHANTIX STARTING MONTH PAK) 0.5 MG X 11 & 1 MG X 42 tablet    Sig: Take one 0.5 mg tablet by mouth once daily for 3 days, then increase to one 0.5 mg tablet twice daily for 4 days, then increase to one 1 mg tablet twice daily.    Dispense:  53 tablet    Refill:  0     Lisa-Marie Rueger M. Nadine CountsGottschalk, DO [facility]

## 2017-05-27 ENCOUNTER — Encounter: Payer: Self-pay | Admitting: Pediatrics

## 2017-05-27 ENCOUNTER — Telehealth: Payer: Self-pay | Admitting: Family Medicine

## 2017-05-27 ENCOUNTER — Ambulatory Visit: Payer: BLUE CROSS/BLUE SHIELD | Admitting: Pediatrics

## 2017-05-27 VITALS — BP 116/82 | HR 93 | Temp 97.1°F | Ht 69.0 in | Wt 224.2 lb

## 2017-05-27 DIAGNOSIS — L02412 Cutaneous abscess of left axilla: Secondary | ICD-10-CM

## 2017-05-27 DIAGNOSIS — L0291 Cutaneous abscess, unspecified: Secondary | ICD-10-CM

## 2017-05-27 DIAGNOSIS — L03119 Cellulitis of unspecified part of limb: Secondary | ICD-10-CM | POA: Diagnosis not present

## 2017-05-27 MED ORDER — DOXYCYCLINE HYCLATE 100 MG PO TABS: 100.0000 mg | ORAL_TABLET | Freq: Two times a day (BID) | ORAL | 0 refills | Status: DC

## 2017-05-27 NOTE — Telephone Encounter (Signed)
Patient aware that note faxed

## 2017-05-27 NOTE — Telephone Encounter (Signed)
OK to fax.

## 2017-05-27 NOTE — Progress Notes (Signed)
  Subjective:   Patient ID: Francisco [name], male    DOB: 11/18/1970, 46 y.o.   MRN: [id number] CC: Skin Infections (Bilateral Axillary, 3 days)  HPI: Francisco [name] is a 46 y.o. male presenting for Skin Infections (Bilateral Axillary, 3 days)  Used very old stick of deodorant that he fell because he ran out of regular 3 days ago.  Later that day developed red hard bumps underneath his arms Have been going daily since then Especially the left side is very sore  Works as a Curatormechanic, his hands all the time Regularly gets small cuts and scrapes on his hands Has 4 dogs at home, not around any other animals No recent cuts or bites or scrapes from the animals Has never had this before  No fevers Normal appetite Otherwise has been feeling well  Relevant past medical, surgical, family and social history reviewed. Allergies and medications reviewed and updated. Social History   Tobacco Use  Smoking Status Current Every Day Smoker  . Packs/day: 0.50  . Years: 25.00  . Pack years: 12.50  . Types: Cigarettes  Smokeless Tobacco Never Used  Tobacco Comment   quit one year ago   ROS: Per HPI   Objective:    BP 116/82   Pulse 93   Temp (!) 97.1 F (36.2 C) (Oral)   Ht 5\' 9"  (1.753 m)   Wt 224 lb 3.2 oz (101.7 kg)   BMI 33.11 kg/m   Wt Readings from Last 3 Encounters:  05/27/17 224 lb 3.2 oz (101.7 kg)  04/22/17 229 lb (103.9 kg)  03/09/17 222 lb 12.8 oz (101.1 kg)    Gen: NAD, alert, cooperative with exam, NCAT EYES: EOMI, no conjunctival injection, or no icterus ENT:   OP without erythema LYMPH: no cervical LAD CV: NRRR, normal S1/S2 Resp: CTABL, no wheezes, normal WOB Abd: +BS, soft, NTND.  Ext: No edema, warm Neuro: Alert and oriented MSK: normal muscle bulk Skin: Left axilla with 2 cm x 2 cm nodule with fluctuance under the skin, some service redness, very tender to palpation, R axilla with apprx 2cm x 1 cm nodule with slight redness, induration vs  fluctuance  Assessment & Plan:  Francisco Postlex was seen today for recurrent skin infections.  Diagnoses and all orders for this visit:  Cellulitis of axilla, unspecified laterality Discussed options, will proceed with I and D of L axilla nodule as is the most bothersome, R side not bothering him, will start doxycycline as below Return precautions discussed -     doxycycline (VIBRA-TABS) 100 MG tablet; Take 1 tablet (100 mg total) by mouth 2 (two) times daily.  Abscess -     Anaerobic and Aerobic Culture   PROCEDURE: After risks, benefits and alternatives discussed with pt and pt decided to procedure with I and D of likely abscess, area was prepped with betadine. 1 mL of lidocaine with epi was injected below the skin. #11 scalpel was used to make a 5mm incision at surface of nodule with return of some purulence and blood. Approx 1 in of iodoform gauze was packed into incision. Pt tolerated procedure well.   Follow up plan: As needed Francisco Krasarol Vincent, MD [facility]

## 2017-05-28 ENCOUNTER — Encounter: Payer: Self-pay | Admitting: Pediatrics

## 2017-06-03 LAB — ANAEROBIC AND AEROBIC CULTURE

## 2017-07-07 ENCOUNTER — Ambulatory Visit (INDEPENDENT_AMBULATORY_CARE_PROVIDER_SITE_OTHER): Payer: BLUE CROSS/BLUE SHIELD | Admitting: Family Medicine

## 2017-07-07 ENCOUNTER — Encounter: Payer: Self-pay | Admitting: Family Medicine

## 2017-07-07 VITALS — BP 126/80 | HR 101 | Temp 97.5°F | Ht 69.0 in | Wt 229.0 lb

## 2017-07-07 DIAGNOSIS — Z0000 Encounter for general adult medical examination without abnormal findings: Secondary | ICD-10-CM | POA: Diagnosis not present

## 2017-07-07 DIAGNOSIS — Z13220 Encounter for screening for lipoid disorders: Secondary | ICD-10-CM

## 2017-07-07 DIAGNOSIS — K429 Umbilical hernia without obstruction or gangrene: Secondary | ICD-10-CM

## 2017-07-07 DIAGNOSIS — R5383 Other fatigue: Secondary | ICD-10-CM | POA: Diagnosis not present

## 2017-07-07 NOTE — Progress Notes (Signed)
BP 126/80   Pulse (!) 101   Temp (!) 97.5 F (36.4 C) (Oral)   Ht '5\' 9"'  (1.753 m)   Wt 229 lb (103.9 kg)   BMI 33.82 kg/m    Subjective:    Patient ID: Francisco [name], male    DOB: 11-19-1970, 47 y.o.   MRN: [id number]  HPI: Francisco [name] is a 47 y.o. male presenting on 07/07/2017 for Annual Exam   HPI Adult well exam and physical Patient is coming in today for an adult well exam and physical.  His only major complaint is that he is having decreased energy and fatigue and that he has an umbilical hernia that he would like to finally get repaired.  He denies any constipation or currently having any blood in his stool.  He denies any severe pain with the hernia but does have some mild pain surrounding it when he pushes on it but otherwise is fine.  He says it has increased in size over the past few years.  He says his energy has just been a lot less and has been gradual over the past few months.  He does admit that he is not sleeping as well since he broke his shoulder but he also admits that he was not sleeping as well before and is a very light sleeper and never gets down into deep sleep. Patient denies any chest pain, shortness of breath, headaches or vision issues, abdominal complaints, diarrhea, nausea, vomiting, or joint issues.   Relevant past medical, surgical, family and social history reviewed and updated as indicated. Interim medical history since our last visit reviewed. Allergies and medications reviewed and updated.  Review of Systems  Constitutional: Positive for fatigue. Negative for chills and fever.  HENT: Negative for ear pain and tinnitus.   Respiratory: Negative for cough, shortness of breath and wheezing.   Cardiovascular: Negative for chest pain, palpitations and leg swelling.  Gastrointestinal: Negative for abdominal pain, blood in stool, constipation and diarrhea.  Genitourinary: Negative for dysuria and hematuria.  Musculoskeletal: Negative for back pain, gait  problem and myalgias.  Skin: Negative for rash.  Neurological: Negative for dizziness, weakness and headaches.  Psychiatric/Behavioral: Positive for sleep disturbance. Negative for self-injury and suicidal ideas.  All other systems reviewed and are negative.   Per HPI unless specifically indicated above   Allergies as of 07/07/2017   No Known Allergies     Medication List        Accurate as of 07/07/17  3:10 PM. Always use your most recent med list.          diclofenac 75 MG EC tablet Commonly known as:  VOLTAREN Take 1 tablet (75 mg total) by mouth 2 (two) times daily.          Objective:    BP 126/80   Pulse (!) 101   Temp (!) 97.5 F (36.4 C) (Oral)   Ht '5\' 9"'  (1.753 m)   Wt 229 lb (103.9 kg)   BMI 33.82 kg/m   Wt Readings from Last 3 Encounters:  07/07/17 229 lb (103.9 kg)  05/27/17 224 lb 3.2 oz (101.7 kg)  04/22/17 229 lb (103.9 kg)    Physical Exam  Constitutional: He is oriented to person, place, and time. He appears well-developed and well-nourished. No distress.  HENT:  Right Ear: External ear normal.  Left Ear: External ear normal.  Nose: Nose normal.  Mouth/Throat: Oropharynx is clear and moist. No oropharyngeal exudate.  Eyes: Conjunctivae  are normal. No scleral icterus.  Neck: Neck supple. No thyromegaly present.  Cardiovascular: Normal rate, regular rhythm, normal heart sounds and intact distal pulses.  No murmur heard. Pulmonary/Chest: Effort normal and breath sounds normal. No respiratory distress. He has no wheezes.  Musculoskeletal: Normal range of motion. He exhibits no edema or tenderness.  Lymphadenopathy:    He has no cervical adenopathy.  Neurological: He is alert and oriented to person, place, and time. Coordination normal.  Skin: Skin is warm and dry. No rash noted. He is not diaphoretic.  Psychiatric: He has a normal mood and affect. His behavior is normal.  Nursing note and vitals reviewed.       Assessment & Plan:    Problem List Items Addressed This Visit      Other   Umbilical hernia without obstruction and without gangrene   Relevant Orders   Ambulatory referral to General Surgery    Other Visit Diagnoses    Well adult exam    -  Primary   Relevant Orders   CMP14+EGFR   CBC with Differential/Platelet   Lipid panel   TSH   Lipid screening       Relevant Orders   Lipid panel   Fatigue, unspecified type       Patient has risk factors for sleep apnea, recommended sleep study but he will wait on that and discuss it in the future.   Relevant Orders   CBC with Differential/Platelet   TSH       Follow up plan: Return in about 1 year (around 07/07/2018), or if symptoms worsen or fail to improve.  Counseling provided for all of the vaccine components Orders Placed This Encounter  Procedures  . CMP14+EGFR  . CBC with Differential/Platelet  . Lipid panel  . TSH  . Ambulatory referral to [place] Elif Yonts, MD [facility] 07/07/2017, 3:10 PM

## 2017-07-08 LAB — CBC WITH DIFFERENTIAL/PLATELET
BASOS ABS: 0.1 10*3/uL (ref 0.0–0.2)
Basos: 1 %
EOS (ABSOLUTE): 0.3 10*3/uL (ref 0.0–0.4)
Eos: 3 %
HEMOGLOBIN: 14.6 g/dL (ref 13.0–17.7)
Hematocrit: 42.6 % (ref 37.5–51.0)
IMMATURE GRANS (ABS): 0 10*3/uL (ref 0.0–0.1)
Immature Granulocytes: 0 %
LYMPHS ABS: 2.1 10*3/uL (ref 0.7–3.1)
LYMPHS: 22 %
MCH: 31.5 pg (ref 26.6–33.0)
MCHC: 34.3 g/dL (ref 31.5–35.7)
MCV: 92 fL (ref 79–97)
MONOCYTES: 6 %
Monocytes Absolute: 0.6 10*3/uL (ref 0.1–0.9)
NEUTROS ABS: 6.8 10*3/uL (ref 1.4–7.0)
Neutrophils: 68 %
PLATELETS: 322 10*3/uL (ref 150–379)
RBC: 4.64 x10E6/uL (ref 4.14–5.80)
RDW: 14.6 % (ref 12.3–15.4)
WBC: 9.9 10*3/uL (ref 3.4–10.8)

## 2017-07-08 LAB — CMP14+EGFR
A/G RATIO: 2 (ref 1.2–2.2)
ALK PHOS: 97 IU/L (ref 39–117)
ALT: 20 IU/L (ref 0–44)
AST: 13 IU/L (ref 0–40)
Albumin: 4.9 g/dL (ref 3.5–5.5)
BUN/Creatinine Ratio: 12 (ref 9–20)
BUN: 13 mg/dL (ref 6–24)
CHLORIDE: 103 mmol/L (ref 96–106)
CO2: 21 mmol/L (ref 20–29)
Calcium: 9.5 mg/dL (ref 8.7–10.2)
Creatinine, Ser: 1.1 mg/dL (ref 0.76–1.27)
GFR calc Af Amer: 94 mL/min/{1.73_m2} (ref >59)
GFR calc non Af Amer: 81 mL/min/{1.73_m2} (ref >59)
GLUCOSE: 121 mg/dL — AB (ref 65–99)
Globulin, Total: 2.4 g/dL (ref 1.5–4.5)
POTASSIUM: 4.5 mmol/L (ref 3.5–5.2)
Sodium: 142 mmol/L (ref 134–144)
Total Protein: 7.3 g/dL (ref 6.0–8.5)

## 2017-07-08 LAB — LIPID PANEL
CHOLESTEROL TOTAL: 200 mg/dL — AB (ref 100–199)
Chol/HDL Ratio: 4.8 ratio (ref 0.0–5.0)
HDL: 42 mg/dL (ref >39)
LDL CALC: 136 mg/dL — AB (ref 0–99)
TRIGLYCERIDES: 109 mg/dL (ref 0–149)
VLDL Cholesterol Cal: 22 mg/dL (ref 5–40)

## 2017-07-08 LAB — TSH: TSH: 0.875 u[IU]/mL (ref 0.450–4.500)

## 2017-07-12 LAB — HGB A1C W/O EAG: HEMOGLOBIN A1C: 5.8 % — AB (ref 4.8–5.6)

## 2017-07-12 LAB — SPECIMEN STATUS REPORT

## 2017-07-21 ENCOUNTER — Ambulatory Visit: Payer: BLUE CROSS/BLUE SHIELD | Admitting: General Surgery

## 2017-07-21 ENCOUNTER — Encounter: Payer: Self-pay | Admitting: General Surgery

## 2017-07-21 VITALS — BP 150/91 | HR 84 | Temp 98.7°F | Resp 18 | Ht 69.0 in | Wt 229.0 lb

## 2017-07-21 DIAGNOSIS — K429 Umbilical hernia without obstruction or gangrene: Secondary | ICD-10-CM | POA: Diagnosis not present
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
## 2017-07-26 NOTE — Patient Instructions (Signed)
Francisco [name]  07/26/2017     @PREFPERIOPPHARMACY @   Your procedure is scheduled on  08/01/2017   Report to [facility] at  825  A.M.  Call this number if you have problems the morning of surgery:  [phone]   Remember:  Do not eat food or drink liquids after midnight.  Take these medicines the morning of surgery with A SIP OF WATER  voltaren.   Do not wear jewelry, make-up or nail polish.  Do not wear lotions, powders, or perfumes, or deodorant.  Do not shave 48 hours prior to surgery.  Men may shave face and neck.  Do not bring valuables to the hospital.  [facility] is not responsible for any belongings or valuables.  Contacts, dentures or bridgework may not be worn into surgery.  Leave your suitcase in the car.  After surgery it may be brought to your room.  For patients admitted to the hospital, discharge time will be determined by your treatment team.  Patients discharged the day of surgery will not be allowed to drive home.   Name and phone number of your driver:   family Special instructions:  None  Please read over the following fact sheets that you were given. Anesthesia Post-op Instructions and Care and Recovery After Surgery       Open Hernia Repair, Adult Open hernia repair is a surgical procedure to fix a hernia. A hernia occurs when an internal organ or tissue pushes out through a weak spot in the abdominal wall muscles. Hernias commonly occur in the groin and around the navel. Most hernias tend to get worse over time. Often, surgery is done to prevent the hernia from becoming bigger, uncomfortable, or an emergency. Emergency surgery may be needed if abdominal contents get stuck in the opening (incarcerated hernia) or the blood supply gets cut off (strangulated hernia). In an open repair, an incision is made in the abdomen to perform the surgery. Tell a health care provider about:  Any allergies you have.  All medicines you are taking,  including vitamins, herbs, eye drops, creams, and over-the-counter medicines.  Any problems you or family members have had with anesthetic medicines.  Any blood or bone disorders you have.  Any surgeries you have had.  Any medical conditions you have, including any recent cold or flu symptoms.  Whether you are pregnant or may be pregnant. What are the risks? Generally, this is a safe procedure. However, problems may occur, including:  Long-lasting (chronic) pain.  Bleeding.  Infection.  Damage to the testicle. This can cause shrinking or swelling.  Damage to the bladder, blood vessels, intestine, or nerves near the hernia.  Trouble passing urine.  Allergic reactions to medicines.  Return of the hernia.  What happens before the procedure? Staying hydrated Follow instructions from your health care provider about hydration, which may include:  Up to 2 hours before the procedure - you may continue to drink clear liquids, such as water, clear fruit juice, black coffee, and plain tea.  Eating and drinking restrictions Follow instructions from your health care provider about eating and drinking, which may include:  8 hours before the procedure - stop eating heavy meals or foods such as meat, fried foods, or fatty foods.  6 hours before the procedure - stop eating light meals or foods, such as toast or cereal.  6 hours before the procedure - stop drinking milk or drinks that contain  milk.  2 hours before the procedure - stop drinking clear liquids.  Medicines  Ask your health care provider about: ? Changing or stopping your regular medicines. This is especially important if you are taking diabetes medicines or blood thinners. ? Taking medicines such as aspirin and ibuprofen. These medicines can thin your blood. Do not take these medicines before your procedure if your health care provider instructs you not to.  You may be given antibiotic medicine to help prevent  infection. General instructions  You may have blood tests or imaging studies.  Ask your health care provider how your surgical site will be marked or identified.  If you smoke, do not smoke for at least 2 weeks before your procedure or for as long as told by your health care provider.  Let your health care provider know if you develop a cold or any infection before your surgery.  Plan to have someone take you home from the hospital or clinic.  If you will be going home right after the procedure, plan to have someone with you for 24 hours. What happens during the procedure?  To reduce your risk of infection: ? Your health care team will wash or sanitize their hands. ? Your skin will be washed with soap. ? Hair may be removed from the surgical area.  An IV tube will be inserted into one of your veins.  You will be given one or more of the following: ? A medicine to help you relax (sedative). ? A medicine to numb the area (local anesthetic). ? A medicine to make you fall asleep (general anesthetic).  Your surgeon will make an incision over the hernia.  The tissues of the hernia will be moved back into place.  The edges of the hernia may be stitched together.  The opening in the abdominal muscles will be closed with stitches (sutures). Or, your surgeon will place a mesh patch made of manmade (synthetic) material over the opening.  The incision will be closed.  A bandage (dressing) may be placed over the incision. The procedure may vary among health care providers and hospitals. What happens after the procedure?  Your blood pressure, heart rate, breathing rate, and blood oxygen level will be monitored until the medicines you were given have worn off.  You may be given medicine for pain.  Do not drive for 24 hours if you received a sedative. This information is not intended to replace advice given to you by your health care provider. Make sure you discuss any questions you  have with your health care provider. Document Released: 12/01/2000 Document Revised: 12/26/2015 Document Reviewed: 11/19/2015 Elsevier Interactive Patient Education  2018 [employer].  Open Hernia Repair, Adult, Care After These instructions give you information about caring for yourself after your procedure. Your doctor may also give you more specific instructions. If you have problems or questions, contact your doctor. Follow these instructions at home: Surgical cut (incision) care   Follow instructions from your doctor about how to take care of your surgical cut area. Make sure you: ? Wash your hands with soap and water before you change your bandage (dressing). If you cannot use soap and water, use hand sanitizer. ? Change your bandage as told by your doctor. ? Leave stitches (sutures), skin glue, or skin tape (adhesive) strips in place. They may need to stay in place for 2 weeks or longer. If tape strips get loose and curl up, you may trim the loose edges. Do not  remove tape strips completely unless your doctor says it is okay.  Check your surgical cut every day for signs of infection. Check for: ? More redness, swelling, or pain. ? More fluid or blood. ? Warmth. ? Pus or a bad smell. Activity  Do not drive or use heavy machinery while taking prescription pain medicine. Do not drive until your doctor says it is okay.  Until your doctor says it is okay: ? Do not lift anything that is heavier than 10 lb (4.5 kg). ? Do not play contact sports.  Return to your normal activities as told by your doctor. Ask your doctor what activities are safe. General instructions  To prevent or treat having a hard time pooping (constipation) while you are taking prescription pain medicine, your doctor may recommend that you: ? Drink enough fluid to keep your pee (urine) clear or pale yellow. ? Take over-the-counter or prescription medicines. ? Eat foods that are high in fiber, such as fresh  fruits and vegetables, whole grains, and beans. ? Limit foods that are high in fat and processed sugars, such as fried and sweet foods.  Take over-the-counter and prescription medicines only as told by your doctor.  Do not take baths, swim, or use a hot tub until your doctor says it is okay.  Keep all follow-up visits as told by your doctor. This is important. Contact a doctor if:  You develop a rash.  You have more redness, swelling, or pain around your surgical cut.  You have more fluid or blood coming from your surgical cut.  Your surgical cut feels warm to the touch.  You have pus or a bad smell coming from your surgical cut.  You have a fever or chills.  You have blood in your poop (stool).  You have not pooped in 2-3 days.  Medicine does not help your pain. Get help right away if:  You have chest pain or you are short of breath.  You feel light-headed.  You feel weak and dizzy (feel faint).  You have very bad pain.  You throw up (vomit) and your pain is worse. This information is not intended to replace advice given to you by your health care provider. Make sure you discuss any questions you have with your health care provider. Document Released: 06/28/2014 Document Revised: 12/26/2015 Document Reviewed: 11/19/2015 Elsevier Interactive Patient Education  2018 [employer]  General Anesthesia, Adult General anesthesia is the use of medicines to make a person "go to sleep" (be unconscious) for a medical procedure. General anesthesia is often recommended when a procedure:  Is long.  Requires you to be still or in an unusual position.  Is major and can cause you to lose blood.  Is impossible to do without general anesthesia.  The medicines used for general anesthesia are called general anesthetics. In addition to making you sleep, the medicines:  Prevent pain.  Control your blood pressure.  Relax your muscles.  Tell a health care provider about:  Any  allergies you have.  All medicines you are taking, including vitamins, herbs, eye drops, creams, and over-the-counter medicines.  Any problems you or family members have had with anesthetic medicines.  Types of anesthetics you have had in the past.  Any bleeding disorders you have.  Any surgeries you have had.  Any medical conditions you have.  Any history of heart or lung conditions, such as heart failure, sleep apnea, or chronic obstructive pulmonary disease (COPD).  Whether you are pregnant or  may be pregnant.  Whether you use tobacco, alcohol, marijuana, or street drugs.  Any history of Financial plannermilitary service.  Any history of depression or anxiety. What are the risks? Generally, this is a safe procedure. However, problems may occur, including:  Allergic reaction to anesthetics.  Lung and heart problems.  Inhaling food or liquids from your stomach into your lungs (aspiration).  Injury to nerves.  Waking up during your procedure and being unable to move (rare).  Extreme agitation or a state of mental confusion (delirium) when you wake up from the anesthetic.  Air in the bloodstream, which can lead to stroke.  These problems are more likely to develop if you are having a major surgery or if you have an advanced medical condition. You can prevent some of these complications by answering all of your health care provider's questions thoroughly and by following all pre-procedure instructions. General anesthesia can cause side effects, including:  Nausea or vomiting  A sore throat from the breathing tube.  Feeling cold or shivery.  Feeling tired, washed out, or achy.  Sleepiness or drowsiness.  Confusion or agitation.  What happens before the procedure? Staying hydrated Follow instructions from your health care provider about hydration, which may include:  Up to 2 hours before the procedure - you may continue to drink clear liquids, such as water, clear fruit juice,  black coffee, and plain tea.  Eating and drinking restrictions Follow instructions from your health care provider about eating and drinking, which may include:  8 hours before the procedure - stop eating heavy meals or foods such as meat, fried foods, or fatty foods.  6 hours before the procedure - stop eating light meals or foods, such as toast or cereal.  6 hours before the procedure - stop drinking milk or drinks that contain milk.  2 hours before the procedure - stop drinking clear liquids.  Medicines  Ask your health care provider about: ? Changing or stopping your regular medicines. This is especially important if you are taking diabetes medicines or blood thinners. ? Taking medicines such as aspirin and ibuprofen. These medicines can thin your blood. Do not take these medicines before your procedure if your health care provider instructs you not to. ? Taking new dietary supplements or medicines. Do not take these during the week before your procedure unless your health care provider approves them.  If you are told to take a medicine or to continue taking a medicine on the day of the procedure, take the medicine with sips of water. General instructions   Ask if you will be going home the same day, the following day, or after a longer hospital stay. ? Plan to have someone take you home. ? Plan to have someone stay with you for the first 24 hours after you leave the hospital or clinic.  For 3-6 weeks before the procedure, try not to use any tobacco products, such as cigarettes, chewing tobacco, and e-cigarettes.  You may brush your teeth on the morning of the procedure, but make sure to spit out the toothpaste. What happens during the procedure?  You will be given anesthetics through a mask and through an IV tube in one of your veins.  You may receive medicine to help you relax (sedative).  As soon as you are asleep, a breathing tube may be used to help you breathe.  An  anesthesia specialist will stay with you throughout the procedure. He or she will help keep you comfortable and safe  by continuing to give you medicines and adjusting the amount of medicine that you get. He or she will also watch your blood pressure, pulse, and oxygen levels to make sure that the anesthetics do not cause any problems.  If a breathing tube was used to help you breathe, it will be removed before you wake up. The procedure may vary among health care providers and hospitals. What happens after the procedure?  You will wake up, often slowly, after the procedure is complete, usually in a recovery area.  Your blood pressure, heart rate, breathing rate, and blood oxygen level will be monitored until the medicines you were given have worn off.  You may be given medicine to help you calm down if you feel anxious or agitated.  If you will be going home the same day, your health care provider may check to make sure you can stand, drink, and urinate.  Your health care providers will treat your pain and side effects before you go home.  Do not drive for 24 hours if you received a sedative.  You may: ? Feel nauseous and vomit. ? Have a sore throat. ? Have mental slowness. ? Feel cold or shivery. ? Feel sleepy. ? Feel tired. ? Feel sore or achy, even in parts of your body where you did not have surgery. This information is not intended to replace advice given to you by your health care provider. Make sure you discuss any questions you have with your health care provider. Document Released: 09/14/2007 Document Revised: 11/18/2015 Document Reviewed: 05/22/2015 Elsevier Interactive Patient Education  2018 [employer]. General Anesthesia, Adult, Care After These instructions provide you with information about caring for yourself after your procedure. Your health care provider may also give you more specific instructions. Your treatment has been planned according to current medical  practices, but problems sometimes occur. Call your health care provider if you have any problems or questions after your procedure. What can I expect after the procedure? After the procedure, it is common to have:  Vomiting.  A sore throat.  Mental slowness.  It is common to feel:  Nauseous.  Cold or shivery.  Sleepy.  Tired.  Sore or achy, even in parts of your body where you did not have surgery.  Follow these instructions at home: For at least 24 hours after the procedure:  Do not: ? Participate in activities where you could fall or become injured. ? Drive. ? Use heavy machinery. ? Drink alcohol. ? Take sleeping pills or medicines that cause drowsiness. ? Make important decisions or sign legal documents. ? Take care of children on your own.  Rest. Eating and drinking  If you vomit, drink water, juice, or soup when you can drink without vomiting.  Drink enough fluid to keep your urine clear or pale yellow.  Make sure you have little or no nausea before eating solid foods.  Follow the diet recommended by your health care provider. General instructions  Have a responsible adult stay with you until you are awake and alert.  Return to your normal activities as told by your health care provider. Ask your health care provider what activities are safe for you.  Take over-the-counter and prescription medicines only as told by your health care provider.  If you smoke, do not smoke without supervision.  Keep all follow-up visits as told by your health care provider. This is important. Contact a health care provider if:  You continue to have nausea or vomiting at  home, and medicines are not helpful.  You cannot drink fluids or start eating again.  You cannot urinate after 8-12 hours.  You develop a skin rash.  You have fever.  You have increasing redness at the site of your procedure. Get help right away if:  You have difficulty breathing.  You have  chest pain.  You have unexpected bleeding.  You feel that you are having a life-threatening or urgent problem. This information is not intended to replace advice given to you by your health care provider. Make sure you discuss any questions you have with your health care provider. Document Released: 09/13/2000 Document Revised: 11/10/2015 Document Reviewed: 05/22/2015 Elsevier Interactive Patient Education  [employer]2018 Elsevier Inc.

## 2017-07-29 ENCOUNTER — Other Ambulatory Visit: Payer: Self-pay

## 2017-07-29 ENCOUNTER — Encounter (HOSPITAL_COMMUNITY)
Admission: RE | Admit: 2017-07-29 | Discharge: 2017-07-29 | Disposition: A | Discharge status: Home / Self Care | Payer: BLUE CROSS/BLUE SHIELD | Source: Ambulatory Visit | Attending: General Surgery | Admitting: General Surgery

## 2017-07-29 ENCOUNTER — Encounter (HOSPITAL_COMMUNITY): Payer: Self-pay

## 2017-07-29 DIAGNOSIS — F17210 Nicotine dependence, cigarettes, uncomplicated: Secondary | ICD-10-CM | POA: Diagnosis not present

## 2017-07-29 DIAGNOSIS — K219 Gastro-esophageal reflux disease without esophagitis: Secondary | ICD-10-CM | POA: Diagnosis not present

## 2017-07-29 DIAGNOSIS — K648 Other hemorrhoids: Secondary | ICD-10-CM | POA: Diagnosis not present

## 2017-07-29 DIAGNOSIS — K429 Umbilical hernia without obstruction or gangrene: Secondary | ICD-10-CM | POA: Diagnosis not present

## 2017-08-01 ENCOUNTER — Other Ambulatory Visit: Payer: Self-pay

## 2017-08-01 ENCOUNTER — Encounter (HOSPITAL_COMMUNITY): Payer: Self-pay

## 2017-08-01 ENCOUNTER — Ambulatory Visit (HOSPITAL_COMMUNITY)
Admission: RE | Admit: 2017-08-01 | Discharge: 2017-08-01 | Disposition: A | Discharge status: Home / Self Care | Payer: BLUE CROSS/BLUE SHIELD | Source: Ambulatory Visit | Attending: General Surgery | Admitting: General Surgery

## 2017-08-01 ENCOUNTER — Ambulatory Visit (HOSPITAL_COMMUNITY): Payer: BLUE CROSS/BLUE SHIELD | Admitting: Anesthesiology

## 2017-08-01 ENCOUNTER — Encounter (HOSPITAL_COMMUNITY)
Admission: RE | Disposition: A | Discharge status: Home / Self Care | Payer: Self-pay | Source: Ambulatory Visit | Attending: General Surgery

## 2017-08-01 DIAGNOSIS — K429 Umbilical hernia without obstruction or gangrene: Secondary | ICD-10-CM | POA: Diagnosis not present

## 2017-08-01 DIAGNOSIS — F17210 Nicotine dependence, cigarettes, uncomplicated: Secondary | ICD-10-CM | POA: Insufficient documentation

## 2017-08-01 DIAGNOSIS — K219 Gastro-esophageal reflux disease without esophagitis: Secondary | ICD-10-CM | POA: Insufficient documentation

## 2017-08-01 DIAGNOSIS — K648 Other hemorrhoids: Secondary | ICD-10-CM | POA: Insufficient documentation

## 2017-08-01 HISTORY — PX: UMBILICAL HERNIA REPAIR: SHX196

## 2017-08-01 SURGERY — REPAIR, HERNIA, UMBILICAL, ADULT
Anesthesia: General

## 2017-08-01 MED ORDER — CHLORHEXIDINE GLUCONATE CLOTH 2 % EX PADS: 6.0000 | MEDICATED_PAD | Freq: Once | CUTANEOUS | Status: DC

## 2017-08-01 MED ORDER — SODIUM CHLORIDE 0.9 % IR SOLN
Status: DC | PRN
  Administered 2017-08-01: 1000 mL

## 2017-08-01 MED ORDER — LACTATED RINGERS IV SOLN
INTRAVENOUS | Status: DC
  Administered 2017-08-01: 10:00:00 via INTRAVENOUS

## 2017-08-01 MED ORDER — MIDAZOLAM HCL 2 MG/2ML IJ SOLN
INTRAMUSCULAR | Status: AC
  Filled 2017-08-01: qty 2

## 2017-08-01 MED ORDER — SUGAMMADEX SODIUM 500 MG/5ML IV SOLN
INTRAVENOUS | Status: DC | PRN
  Administered 2017-08-01: 204.2 mg via INTRAVENOUS

## 2017-08-01 MED ORDER — HYDROMORPHONE HCL 1 MG/ML IJ SOLN
INTRAMUSCULAR | Status: AC
  Filled 2017-08-01: qty 0.5

## 2017-08-01 MED ORDER — FENTANYL CITRATE (PF) 100 MCG/2ML IJ SOLN
INTRAMUSCULAR | Status: DC | PRN
  Administered 2017-08-01 (×5): 50 ug via INTRAVENOUS

## 2017-08-01 MED ORDER — SUCCINYLCHOLINE CHLORIDE 20 MG/ML IJ SOLN
INTRAMUSCULAR | Status: AC
  Filled 2017-08-01: qty 1

## 2017-08-01 MED ORDER — SUCCINYLCHOLINE CHLORIDE 20 MG/ML IJ SOLN
INTRAMUSCULAR | Status: DC | PRN
  Administered 2017-08-01: 180 mg via INTRAVENOUS

## 2017-08-01 MED ORDER — ONDANSETRON HCL 4 MG/2ML IJ SOLN
INTRAMUSCULAR | Status: AC
  Filled 2017-08-01: qty 2

## 2017-08-01 MED ORDER — OXYCODONE HCL 5 MG PO TABS: 5.0000 mg | ORAL_TABLET | ORAL | 0 refills | Status: DC | PRN

## 2017-08-01 MED ORDER — FENTANYL CITRATE (PF) 250 MCG/5ML IJ SOLN
INTRAMUSCULAR | Status: AC
  Filled 2017-08-01: qty 5

## 2017-08-01 MED ORDER — FENTANYL CITRATE (PF) 100 MCG/2ML IJ SOLN: 25.0000 ug | INTRAMUSCULAR | Status: DC | PRN

## 2017-08-01 MED ORDER — FENTANYL CITRATE (PF) 100 MCG/2ML IJ SOLN
INTRAMUSCULAR | Status: AC
  Filled 2017-08-01: qty 2

## 2017-08-01 MED ORDER — BUPIVACAINE LIPOSOME 1.3 % IJ SUSP
INTRAMUSCULAR | Status: AC
  Filled 2017-08-01: qty 20

## 2017-08-01 MED ORDER — LIDOCAINE HCL (PF) 1 % IJ SOLN
INTRAMUSCULAR | Status: AC
  Filled 2017-08-01: qty 5

## 2017-08-01 MED ORDER — KETOROLAC TROMETHAMINE 30 MG/ML IJ SOLN
30.0000 mg | Freq: Once | INTRAMUSCULAR | Status: AC
  Administered 2017-08-01: 30 mg via INTRAVENOUS

## 2017-08-01 MED ORDER — HYDROMORPHONE HCL 1 MG/ML IJ SOLN
0.5000 mg | INTRAMUSCULAR | Status: AC
  Administered 2017-08-01 (×2): 0.5 mg via INTRAVENOUS

## 2017-08-01 MED ORDER — ROCURONIUM BROMIDE 100 MG/10ML IV SOLN
INTRAVENOUS | Status: DC | PRN
  Administered 2017-08-01: 40 mg via INTRAVENOUS

## 2017-08-01 MED ORDER — HYDROMORPHONE 1 MG/ML IV SOLN
INTRAVENOUS | Status: AC
Start: 2017-08-01 — End: ?
  Filled 2017-08-01: qty 25

## 2017-08-01 MED ORDER — HYDROMORPHONE HCL 1 MG/ML IJ SOLN
0.5000 mg | INTRAMUSCULAR | Status: AC
  Administered 2017-08-01 (×4): 0.5 mg via INTRAVENOUS

## 2017-08-01 MED ORDER — ONDANSETRON HCL 4 MG/2ML IJ SOLN
4.0000 mg | Freq: Once | INTRAMUSCULAR | Status: AC
  Administered 2017-08-01: 4 mg via INTRAVENOUS

## 2017-08-01 MED ORDER — PROPOFOL 10 MG/ML IV BOLUS
INTRAVENOUS | Status: DC | PRN
  Administered 2017-08-01: 180 mg via INTRAVENOUS

## 2017-08-01 MED ORDER — LIDOCAINE HCL (CARDIAC) 20 MG/ML IV SOLN
INTRAVENOUS | Status: DC | PRN
  Administered 2017-08-01: 50 mg via INTRAVENOUS

## 2017-08-01 MED ORDER — MIDAZOLAM HCL 2 MG/2ML IJ SOLN
1.0000 mg | INTRAMUSCULAR | Status: AC
  Administered 2017-08-01: 2 mg via INTRAVENOUS

## 2017-08-01 MED ORDER — BUPIVACAINE LIPOSOME 1.3 % IJ SUSP
INTRAMUSCULAR | Status: DC | PRN
  Administered 2017-08-01: 20 mL

## 2017-08-01 MED ORDER — PROPOFOL 10 MG/ML IV BOLUS
INTRAVENOUS | Status: AC
  Filled 2017-08-01: qty 40

## 2017-08-01 MED ORDER — CEFAZOLIN SODIUM-DEXTROSE 2-4 GM/100ML-% IV SOLN
2.0000 g | INTRAVENOUS | Status: AC
  Administered 2017-08-01: 2 g via INTRAVENOUS
  Filled 2017-08-01: qty 100

## 2017-08-01 MED ORDER — KETOROLAC TROMETHAMINE 30 MG/ML IJ SOLN
INTRAMUSCULAR | Status: AC
  Filled 2017-08-01: qty 1

## 2017-08-01 SURGICAL SUPPLY — 35 items
BAG HAMPER (MISCELLANEOUS) ×2 IMPLANT
BLADE SURG 15 STRL LF DISP TIS (BLADE) ×1 IMPLANT
BLADE SURG 15 STRL SS (BLADE) ×1
CHLORAPREP W/TINT 26ML (MISCELLANEOUS) ×2 IMPLANT
CLOTH BEACON ORANGE TIMEOUT ST (SAFETY) ×2 IMPLANT
COVER LIGHT HANDLE STERIS (MISCELLANEOUS) ×4 IMPLANT
DERMABOND ADHESIVE PROPEN (GAUZE/BANDAGES/DRESSINGS) ×1
DERMABOND ADVANCED (GAUZE/BANDAGES/DRESSINGS) ×1
DERMABOND ADVANCED .7 DNX12 (GAUZE/BANDAGES/DRESSINGS) ×1 IMPLANT
DERMABOND ADVANCED .7 DNX6 (GAUZE/BANDAGES/DRESSINGS) ×1 IMPLANT
ELECT REM PT RETURN 9FT ADLT (ELECTROSURGICAL) ×2
ELECTRODE REM PT RTRN 9FT ADLT (ELECTROSURGICAL) ×1 IMPLANT
GLOVE BIO SURGEON STRL SZ 6.5 (GLOVE) ×2 IMPLANT
GLOVE BIOGEL PI IND STRL 6.5 (GLOVE) ×1 IMPLANT
GLOVE BIOGEL PI IND STRL 7.0 (GLOVE) ×1 IMPLANT
GLOVE BIOGEL PI INDICATOR 6.5 (GLOVE) ×1
GLOVE BIOGEL PI INDICATOR 7.0 (GLOVE) ×1
GOWN STRL REUS W/ TWL LRG LVL3 (GOWN DISPOSABLE) ×1 IMPLANT
GOWN STRL REUS W/TWL LRG LVL3 (GOWN DISPOSABLE) ×3 IMPLANT
INST SET MINOR GENERAL (KITS) ×2 IMPLANT
KIT ROOM TURNOVER APOR (KITS) ×2 IMPLANT
MANIFOLD NEPTUNE II (INSTRUMENTS) ×2 IMPLANT
MESH VENTRALEX ST 8CM LRG (Mesh General) ×2 IMPLANT
NEEDLE HYPO 22GX1.5 SAFETY (NEEDLE) ×2 IMPLANT
NS IRRIG 1000ML POUR BTL (IV SOLUTION) ×2 IMPLANT
PACK MINOR (CUSTOM PROCEDURE TRAY) ×2 IMPLANT
PAD ARMBOARD 7.5X6 YLW CONV (MISCELLANEOUS) ×2 IMPLANT
SET BASIN LINEN APH (SET/KITS/TRAYS/PACK) ×2 IMPLANT
SPONGE GAUZE 2X2 8PLY STRL LF (GAUZE/BANDAGES/DRESSINGS) ×2 IMPLANT
SUT ETHIBOND NAB MO 7 #0 18IN (SUTURE) ×2 IMPLANT
SUT MNCRL AB 4-0 PS2 18 (SUTURE) ×2 IMPLANT
SUT VIC AB 3-0 SH 27 (SUTURE) ×1
SUT VIC AB 3-0 SH 27XBRD (SUTURE) ×1 IMPLANT
SYR 20CC LL (SYRINGE) ×2 IMPLANT
TAPE HYPAFIX 6X30 (GAUZE/BANDAGES/DRESSINGS) ×2 IMPLANT

## 2017-08-01 NOTE — Pre-Procedure Instructions (Signed)
Patient with violent awakening. Hernia repair appears intact. Abdominal binder going on now.  Algis GreenhouseLindsay Cletis Muma, MD

## 2017-08-01 NOTE — Transfer of Care (Signed)
Immediate Anesthesia Transfer of Care Note  Patient: Francisco [name]  Procedure(s) Performed: HERNIA REPAIR UMBILICAL ADULT WITH MESH (N/A )  Patient Location: PACU  Anesthesia Type:General  Level of Consciousness: awake, alert , oriented and patient cooperative  Airway & Oxygen Therapy: Patient Spontanous Breathing  Post-op Assessment: Report given to RN and Post -op Vital signs reviewed and stable  Post vital signs: Reviewed and stable  Last Vitals:  Vitals:   08/01/17 0950 08/01/17 0955  BP: 114/76 119/82  Pulse:    Resp: 18 17  Temp:    SpO2: 97% 98%    Last Pain:  Vitals:   08/01/17 0919  TempSrc: Oral  PainSc: 0-No pain         Complications: No apparent anesthesia complications

## 2017-08-01 NOTE — Anesthesia Postprocedure Evaluation (Signed)
Anesthesia Post Note  Patient: Francisco [name]  Procedure(s) Performed: HERNIA REPAIR UMBILICAL ADULT WITH MESH (N/A )  Patient location during evaluation: PACU Anesthesia Type: General Level of consciousness: awake and alert, oriented and patient cooperative Pain management: pain level controlled Vital Signs Assessment: post-procedure vital signs reviewed and stable Respiratory status: spontaneous breathing and respiratory function stable Cardiovascular status: stable Postop Assessment: no apparent nausea or vomiting Anesthetic complications: no     Last Vitals:  Vitals:   08/01/17 1130 08/01/17 1223  BP: 126/74 117/77  Pulse: 92 81  Resp: (!) 25 16  Temp: 37.1 C (!) 36.3 C  SpO2: 93% 94%    Last Pain:  Vitals:   08/01/17 1223  TempSrc: Oral  PainSc: 2                  Berry Godsey A

## 2017-08-01 NOTE — Anesthesia Preprocedure Evaluation (Signed)
Anesthesia Evaluation  Patient identified by MRN, date of birth, ID band Patient awake    Reviewed: Allergy & Precautions, H&P , NPO status , Patient's Chart, lab work & pertinent test results  Airway Mallampati: I  TM Distance: >3 FB Neck ROM: Full    Dental no notable dental hx. (+) Caps,    Pulmonary neg pulmonary ROS, Current Smoker,    Pulmonary exam normal        Cardiovascular negative cardio ROS   Rhythm:Regular Rate:Normal     Neuro/Psych negative neurological ROS  negative psych ROS   GI/Hepatic Neg liver ROS, GERD  ,  Endo/Other  negative endocrine ROS  Renal/GU negative Renal ROS  negative genitourinary   Musculoskeletal negative musculoskeletal ROS (+)   Abdominal Normal abdominal exam  (+)   Peds  Hematology negative hematology ROS (+)   Anesthesia Other Findings   Reproductive/Obstetrics                             Anesthesia Physical Anesthesia Plan  ASA: II  Anesthesia Plan: General   Post-op Pain Management:    Induction: Intravenous, Rapid sequence and Cricoid pressure planned  PONV Risk Score and Plan:   Airway Management Planned: Oral ETT  Additional Equipment:   Intra-op Plan:   Post-operative Plan: Extubation in OR  Informed Consent: I have reviewed the patients History and Physical, chart, labs and discussed the procedure including the risks, benefits and alternatives for the proposed anesthesia with the patient or authorized representative who has indicated his/her understanding and acceptance.     Plan Discussed with: CRNA  Anesthesia Plan Comments:         Anesthesia Quick Evaluation

## 2017-08-01 NOTE — Interval H&P Note (Signed)
History and Physical Interval Note:  08/01/2017 9:25 AM  Francisco [name]  has presented today for surgery, with the diagnosis of umbilical hernia  The various methods of treatment have been discussed with the patient and family. After consideration of risks, benefits and other options for treatment, the patient has consented to  Procedure(s): HERNIA REPAIR UMBILICAL ADULT WITH MESH (N/A) as a surgical intervention .  The patient's history has been reviewed, patient examined, no change in status, stable for surgery.  I have reviewed the patient's chart and labs.  Questions were answered to the patient's satisfaction.    No additional questions. Father picking him up following surgery.  Lucretia RoersLindsay C Natacha Jepsen

## 2017-08-01 NOTE — Discharge Instructions (Signed)
Discharge Instructions: Try to leave the dressing in place for 48 hours if possible.  During that time try to not get that dressing wet, so take a birdbath.  Following this you can Shower per your regular routine.  Take tylenol and ibuprofen as needed for pain control, alternating every 4-6 hours.  Take Roxicodone for breakthrough pain. Take colace for constipation related to narcotic pain medication. Do not pick at the dermabond glue on your incision sites.      Open Hernia Repair, Adult, Care After These instructions give you information about caring for yourself after your procedure. Your doctor may also give you more specific instructions. If you have problems or questions, contact your doctor. Follow these instructions at home: Surgical cut (incision) care   Follow instructions from your doctor about how to take care of your surgical cut area. Make sure you: ? Wash your hands with soap and water before you change your bandage (dressing). If you cannot use soap and water, use hand sanitizer. ? Change your bandage as told by your doctor. ? Leave stitches (sutures), skin glue, or skin tape (adhesive) strips in place. They may need to stay in place for 2 weeks or longer. If tape strips get loose and curl up, you may trim the loose edges. Do not remove tape strips completely unless your doctor says it is okay.  Check your surgical cut every day for signs of infection. Check for: ? More redness, swelling, or pain. ? More fluid or blood. ? Warmth. ? Pus or a bad smell. Activity  Do not drive or use heavy machinery while taking prescription pain medicine. Do not drive until your doctor says it is okay.  Until your doctor says it is okay: ? Do not lift anything that is heavier than 10 lb (4.5 kg). ? Do not play contact sports.  Return to your normal activities as told by your doctor. Ask your doctor what activities are safe. General instructions  To prevent or treat having a hard  time pooping (constipation) while you are taking prescription pain medicine, your doctor may recommend that you: ? Drink enough fluid to keep your pee (urine) clear or pale yellow. ? Take over-the-counter or prescription medicines. ? Eat foods that are high in fiber, such as fresh fruits and vegetables, whole grains, and beans. ? Limit foods that are high in fat and processed sugars, such as fried and sweet foods.  Take over-the-counter and prescription medicines only as told by your doctor.  Do not take baths, swim, or use a hot tub until your doctor says it is okay.  Keep all follow-up visits as told by your doctor. This is important. Contact a doctor if:  You develop a rash.  You have more redness, swelling, or pain around your surgical cut.  You have more fluid or blood coming from your surgical cut.  Your surgical cut feels warm to the touch.  You have pus or a bad smell coming from your surgical cut.  You have a fever or chills.  You have blood in your poop (stool).  You have not pooped in 2-3 days.  Medicine does not help your pain. Get help right away if:  You have chest pain or you are short of breath.  You feel light-headed.  You feel weak and dizzy (feel faint).  You have very bad pain.  You throw up (vomit) and your pain is worse. This information is not intended to replace advice given to you by  your health care provider. Make sure you discuss any questions you have with your health care provider. Document Released: 06/28/2014 Document Revised: 12/26/2015 Document Reviewed: 11/19/2015 Elsevier Interactive Patient Education  2018 [employer]   General Anesthesia, Adult, Care After These instructions provide you with information about caring for yourself after your procedure. Your health care provider may also give you more specific instructions. Your treatment has been planned according to current medical practices, but problems sometimes occur. Call your  health care provider if you have any problems or questions after your procedure. What can I expect after the procedure? After the procedure, it is common to have:  Vomiting.  A sore throat.  Mental slowness.  It is common to feel:  Nauseous.  Cold or shivery.  Sleepy.  Tired.  Sore or achy, even in parts of your body where you did not have surgery.  Follow these instructions at home: For at least 24 hours after the procedure:  Do not: ? Participate in activities where you could fall or become injured. ? Drive. ? Use heavy machinery. ? Drink alcohol. ? Take sleeping pills or medicines that cause drowsiness. ? Make important decisions or sign legal documents. ? Take care of children on your own.  Rest. Eating and drinking  If you vomit, drink water, juice, or soup when you can drink without vomiting.  Drink enough fluid to keep your urine clear or pale yellow.  Make sure you have little or no nausea before eating solid foods.  Follow the diet recommended by your health care provider. General instructions  Have a responsible adult stay with you until you are awake and alert.  Return to your normal activities as told by your health care provider. Ask your health care provider what activities are safe for you.  Take over-the-counter and prescription medicines only as told by your health care provider.  If you smoke, do not smoke without supervision.  Keep all follow-up visits as told by your health care provider. This is important. Contact a health care provider if:  You continue to have nausea or vomiting at home, and medicines are not helpful.  You cannot drink fluids or start eating again.  You cannot urinate after 8-12 hours.  You develop a skin rash.  You have fever.  You have increasing redness at the site of your procedure. Get help right away if:  You have difficulty breathing.  You have chest pain.  You have unexpected bleeding.  You  feel that you are having a life-threatening or urgent problem. This information is not intended to replace advice given to you by your health care provider. Make sure you discuss any questions you have with your health care provider. Document Released: 09/13/2000 Document Revised: 11/10/2015 Document Reviewed: 05/22/2015 Elsevier Interactive Patient Education  [employer]2018 [employer].

## 2017-08-01 NOTE — Progress Notes (Signed)
Patient awakened from anesthesia violently. Hernia repair intact at this time. Binder placed.  Algis GreenhouseLindsay Bridges, MD

## 2017-08-01 NOTE — Anesthesia Procedure Notes (Signed)
Procedure Name: Intubation Date/Time: 08/01/2017 10:26 AM Performed by: Pernell DupreAdams, Amy A, CRNA Pre-anesthesia Checklist: Patient identified, Patient being monitored, Timeout performed, Emergency Drugs available and Suction available Patient Re-evaluated:Patient Re-evaluated prior to induction Oxygen Delivery Method: Circle System Utilized Preoxygenation: Pre-oxygenation with 100% oxygen Induction Type: IV induction, Cricoid Pressure applied and Rapid sequence Ventilation: Mask ventilation without difficulty Laryngoscope Size: Miller and 3 Grade View: Grade I Tube type: Oral Tube size: 7.0 mm Number of attempts: 1 Airway Equipment and Method: Stylet Placement Confirmation: ETT inserted through vocal cords under direct vision,  positive ETCO2 and breath sounds checked- equal and bilateral Secured at: 23 cm Tube secured with: Tape Dental Injury: Teeth and Oropharynx as per pre-operative assessment

## 2017-08-01 NOTE — Op Note (Signed)
[facility] Operative Note  08/01/17  Preoperative Diagnosis: Umbilical Hernia    Postoperative Diagnosis: Same   Procedure(s) Performed: Umbilical hernia  Repair with mesh (Ventralex Hernia Patch 8cm)   Surgeon: Leatrice JewelsLindsay C. Henreitta LeberBridges, MD   Assistants: No qualified resident was available   Anesthesia: General endotracheal   Anesthesiologist: Laurene FootmanGonzalez, Luis, MD    Specimens:  NOne   Estimated Blood Loss: Minimal   Blood Replacement: None    Complications: None   Wound Class: Clean    Operative Indications: Francisco [name] is a 47 yo otherwise healthy gentleman who has an umbilical hernia that has been getting larger of the last few years and causing him discomfort. Given these findings, we discussed the risk and benefits of hernia repair with mesh, and he opted to proceed.   Findings: Umbilical hernia containing omentum, reduced, 1.5cm defect    Procedure: The patient was taken to the operating room and placed supine. General endotracheal anesthesia was induced. Intravenous antibiotics were administered per protocol.  An orogastric tube positioned to decompress the stomach. The abdomen was prepared and draped in the usual sterile fashion.   The umbilical hernia was reduced without issues. The defect measured approximately 1.5cm.  An infraumbilical incision was made and carried down through the subcutaneous tissue with cautery. The hernia sac was dissected out and encircled with a hemostat and divided once I verified it had intraabdominal contents within the sac.  The intraabdominal peritoneal lining was free from any adhesions. The fascial defect was cleaned off, and a 8cm Ventralex circular mesh was placed into the area given the size of the defect. The mesh was flat against the abdominal wall. The tags were tacked to the fascia with 2-0 Novafil. The fascial defect was closed without tension over the mesh with interrupted 2-0 Novafil.    The umbilicus was reattached  to the fascia with a 3-0 Vicryl suture, and the skin was closed with a 4-0 Monocryl and dermabond.    Final inspection revealed acceptable hemostasis. All counts were correct at the end of the case.  A small 2X2 was folded and placed into the umbilicus with paper tape. The patient was awakened from anesthesia and extubate without complication.  The patient went to the PACU in stable condition.   Algis GreenhouseLindsay Bridges, MD [facility] [address] [place], [place] [id number] [phone] (office)

## 2017-08-02 ENCOUNTER — Encounter (HOSPITAL_COMMUNITY): Payer: Self-pay | Admitting: General Surgery

## 2017-08-16 ENCOUNTER — Ambulatory Visit (INDEPENDENT_AMBULATORY_CARE_PROVIDER_SITE_OTHER): Payer: Self-pay | Admitting: General Surgery

## 2017-08-16 ENCOUNTER — Encounter: Payer: Self-pay | Admitting: General Surgery

## 2017-08-16 VITALS — BP 120/74 | HR 85 | Temp 98.6°F | Resp 18 | Ht 69.0 in | Wt 226.0 lb

## 2017-08-16 DIAGNOSIS — K429 Umbilical hernia without obstruction or gangrene: Secondary | ICD-10-CM

## 2017-08-16 MED ORDER — OXYCODONE HCL 5 MG PO TABS: 5.0000 mg | ORAL_TABLET | ORAL | 0 refills | Status: DC | PRN

## 2017-08-16 NOTE — Patient Instructions (Signed)
Open Hernia Repair, Adult, Care After These instructions give you information about caring for yourself after your procedure. Your doctor may also give you more specific instructions. If you have problems or questions, contact your doctor. Follow these instructions at home: Surgical cut (incision) care   Follow instructions from your doctor about how to take care of your surgical cut area. Make sure you: ? Wash your hands with soap and water before you change your bandage (dressing). If you cannot use soap and water, use hand sanitizer. ? Change your bandage as told by your doctor. ? Leave stitches (sutures), skin glue, or skin tape (adhesive) strips in place. They may need to stay in place for 2 weeks or longer. If tape strips get loose and curl up, you may trim the loose edges. Do not remove tape strips completely unless your doctor says it is okay.  Check your surgical cut every day for signs of infection. Check for: ? More redness, swelling, or pain. ? More fluid or blood. ? Warmth. ? Pus or a bad smell. Activity  Do not drive or use heavy machinery while taking prescription pain medicine. Do not drive until your doctor says it is okay.  Until your doctor says it is okay: ? Do not lift anything that is heavier than 10 lb (4.5 kg). ? Do not play contact sports.  Return to your normal activities as told by your doctor. Ask your doctor what activities are safe. General instructions  To prevent or treat having a hard time pooping (constipation) while you are taking prescription pain medicine, your doctor may recommend that you: ? Drink enough fluid to keep your pee (urine) clear or pale yellow. ? Take over-the-counter or prescription medicines. ? Eat foods that are high in fiber, such as fresh fruits and vegetables, whole grains, and beans. ? Limit foods that are high in fat and processed sugars, such as fried and sweet foods.  Take over-the-counter and prescription medicines only as  told by your doctor.  Do not take baths, swim, or use a hot tub until your doctor says it is okay.  Keep all follow-up visits as told by your doctor. This is important. Contact a doctor if:  You develop a rash.  You have more redness, swelling, or pain around your surgical cut.  You have more fluid or blood coming from your surgical cut.  Your surgical cut feels warm to the touch.  You have pus or a bad smell coming from your surgical cut.  You have a fever or chills.  You have blood in your poop (stool).  You have not pooped in 2-3 days.  Medicine does not help your pain. Get help right away if:  You have chest pain or you are short of breath.  You feel light-headed.  You feel weak and dizzy (feel faint).  You have very bad pain.  You throw up (vomit) and your pain is worse. This information is not intended to replace advice given to you by your health care provider. Make sure you discuss any questions you have with your health care provider. Document Released: 06/28/2014 Document Revised: 12/26/2015 Document Reviewed: 11/19/2015 Elsevier Interactive Patient Education  2018 Elsevier Inc.  Umbilical Hernia, Adult A hernia is a bulge of tissue that pushes through an opening between muscles. An umbilical hernia happens in the abdomen, near the belly button (umbilicus). The hernia may contain tissues from the small intestine, large intestine, or fatty tissue covering the intestines (omentum). Umbilical hernias  in adults tend to get worse over time, and they require surgical treatment. There are several types of umbilical hernias. You may have: A hernia located just above or below the umbilicus (indirect hernia). This is the most common type of umbilical hernia in adults. A hernia that forms through an opening formed by the umbilicus (direct hernia). A hernia that comes and goes (reducible hernia). A reducible hernia may be visible only when you strain, lift something heavy,  or cough. This type of hernia can be pushed back into the abdomen (reduced). A hernia that traps abdominal tissue inside the hernia (incarcerated hernia). This type of hernia cannot be reduced. A hernia that cuts off blood flow to the tissues inside the hernia (strangulated hernia). The tissues can start to die if this happens. This type of hernia requires emergency treatment.  What are the causes? An umbilical hernia happens when tissue inside the abdomen presses on a weak area of the abdominal muscles. What increases the risk? You may have a greater risk of this condition if you: Are obese. Have had several pregnancies. Have a buildup of fluid inside your abdomen (ascites). Have had surgery that weakens the abdominal muscles.  What are the signs or symptoms? The main symptom of this condition is a painless bulge at or near the belly button. A reducible hernia may be visible only when you strain, lift something heavy, or cough. Other symptoms may include: Dull pain. A feeling of pressure.  Symptoms of a strangulated hernia may include: Pain that gets increasingly worse. Nausea and vomiting. Pain when pressing on the hernia. Skin over the hernia becoming red or purple. Constipation. Blood in the stool.  How is this diagnosed? This condition may be diagnosed based on: A physical exam. You may be asked to cough or strain while standing. These actions increase the pressure inside your abdomen and force the hernia through the opening in your muscles. Your health care provider may try to reduce the hernia by pressing on it. Your symptoms and medical history.  How is this treated? Surgery is the only treatment for an umbilical hernia. Surgery for a strangulated hernia is done as soon as possible. If you have a small hernia that is not incarcerated, you may need to lose weight before having surgery. Follow these instructions at home: Lose weight, if told by your health care provider. Do  not try to push the hernia back in. Watch your hernia for any changes in color or size. Tell your health care provider if any changes occur. You may need to avoid activities that increase pressure on your hernia. Do not lift anything that is heavier than 10 lb (4.5 kg) until your health care provider says that this is safe. Take over-the-counter and prescription medicines only as told by your health care provider. Keep all follow-up visits as told by your health care provider. This is important. Contact a health care provider if: Your hernia gets larger. Your hernia becomes painful. Get help right away if: You develop sudden, severe pain near the area of your hernia. You have pain as well as nausea or vomiting. You have pain and the skin over your hernia changes color. You develop a fever. This information is not intended to replace advice given to you by your health care provider. Make sure you discuss any questions you have with your health care provider. Document Released: 11/07/2015 Document Revised: 02/08/2016 Document Reviewed: 11/07/2015 Elsevier Interactive Patient Education  [employer]2018 [employer]

## 2017-08-16 NOTE — Progress Notes (Signed)
[facility] Note   HPI:  47 y.o. Male presents to clinic for post-op follow-up evaluation after an umbilical hernia repair. He lifted some drinks at the grocery store, weighing more than 10 lbs, and immediately felt a tear and has had pain since that time. He has not noticed a bulge in the belly button but feels that the area superior is somewhat more swollen. He is having daily BMs and no nausea/vomiting. He is not constipated. He has been out of the Roxicodone.   Review of Systems:  + pain and swelling about the belly button No nausea/vomiting Normal BMs  All other review of systems: otherwise negative   Vital Signs:  BP 120/74   Pulse 85   Temp 98.6 F (37 C)   Resp 18   Ht 5\' 9"  (1.753 m)   Wt 226 lb (102.5 kg)   BMI 33.37 kg/m    Physical Exam:  Physical Exam  Constitutional: He is well-developed, well-nourished, and in no distress.  HENT:  Head: Normocephalic.  Eyes: Pupils are equal, round, and reactive to light.  Neck: Normal range of motion.  Pulmonary/Chest: Effort normal.  Abdominal: Soft. He exhibits no distension.  Some diastasis in the epigastric region, no hernia defect noted, indurated around the belly button, but no obvious defect, tender, incision healed without swelling or drainage or erythema     Laboratory studies: None    Imaging:  None    Assessment:  47 y.o. yo Male with some pain at the umbilical hernia site. He is concerned for a recurrence after lifting.  He knows now not to lift anything over 10 lbs.  He says he will be complaint. Difficult to say if recurrence at the umbilicus given the induration, but no defect appreciated. Epigastric area with some diastasis but no hernia.  Plan:  - Roxicodone 5mg  take 1-2 tab every 4 hr PRN for pain (#20) given to the patient for pain control  - Monitor for signs of recurrence, bulge getting larger, increased pain, or nausea/vomiting  - Follow up in 2 weeks and see if any improvement, if  not better then will do some imaging, likely CT scan    All of the above recommendations were discussed with the patient, and all of patient's qeestions were answered to his expressed satisfaction.  Algis GreenhouseLindsay Bridges, MD [facility] [address] [place], [place] [id number] [phone] (office)

## 2017-09-01 ENCOUNTER — Ambulatory Visit (INDEPENDENT_AMBULATORY_CARE_PROVIDER_SITE_OTHER): Payer: Self-pay | Admitting: General Surgery

## 2017-09-01 ENCOUNTER — Encounter: Payer: Self-pay | Admitting: General Surgery

## 2017-09-01 VITALS — BP 114/87 | HR 96 | Temp 98.9°F | Resp 18 | Ht 69.0 in | Wt 227.0 lb

## 2017-09-01 DIAGNOSIS — K429 Umbilical hernia without obstruction or gangrene: Secondary | ICD-10-CM

## 2017-09-01 NOTE — Progress Notes (Addendum)
[facility] Note   HPI:  47 y.o. Male presents to clinic for post-op follow-up evaluation for an umbilical hernia repair. He has been seen a few weeks ago and had swelling in the area.  He says he is much improved.  He still is complaining of the bulge in his upper abdomen and his wife is worried about this being a hernia.   Review of Systems:  Eating diet No pain  Swelling improved  All other review of systems: otherwise negative   Vital Signs:  BP 114/87   Pulse 96   Temp 98.9 F (37.2 C)   Resp 18   Ht 5\' 9"  (1.753 m)   Wt 227 lb (103 kg)   BMI 33.52 kg/m    Physical Exam:  Physical Exam  Constitutional: He is well-developed, well-nourished, and in no distress.  HENT:  Head: Normocephalic.  Eyes: Pupils are equal, round, and reactive to light.  Cardiovascular: Normal rate.  Pulmonary/Chest: Effort normal.  Abdominal: Soft. He exhibits no distension. There is no tenderness.  Diastasis recti in the epigastric region; umbilical area with some induration, non tender, no signs of recurrence   Vitals reviewed.   Laboratory studies: None   Imaging: None   Assessment:  47 y.o. yo Male who has improved post operatively. He likely had a small seroma that has started to resolve, and his pain is better. The bulge in his upper abdomen is diastasis recti and I discussed this with him and possible exercises like planks to help.  I told him that this is not a true hernia but a laxity of the abdominal wall.  Plan:  - Restrictions until 4/1 no lifting > 10 lbs, bending, squatting excessively, can return to work on 4/1 without restrictions  - Follow up as needed   All of the above recommendations were discussed with the patient, and all of patient's questions were answered to his expressed satisfaction.  Algis GreenhouseLindsay Pearson Picou, MD [facility] [address] [place], [place] [phone] (office)

## 2017-11-16 DIAGNOSIS — B078 Other viral warts: Secondary | ICD-10-CM | POA: Diagnosis not present

## 2017-11-16 DIAGNOSIS — M79671 Pain in right foot: Secondary | ICD-10-CM | POA: Diagnosis not present

## 2017-12-05 ENCOUNTER — Ambulatory Visit (INDEPENDENT_AMBULATORY_CARE_PROVIDER_SITE_OTHER): Payer: BLUE CROSS/BLUE SHIELD | Admitting: Family Medicine

## 2017-12-05 ENCOUNTER — Ambulatory Visit (INDEPENDENT_AMBULATORY_CARE_PROVIDER_SITE_OTHER): Payer: BLUE CROSS/BLUE SHIELD

## 2017-12-05 ENCOUNTER — Encounter: Payer: Self-pay | Admitting: Family Medicine

## 2017-12-05 VITALS — BP 125/84 | HR 82 | Temp 97.1°F | Ht 69.0 in | Wt 225.0 lb

## 2017-12-05 DIAGNOSIS — M25511 Pain in right shoulder: Secondary | ICD-10-CM | POA: Diagnosis not present

## 2017-12-05 DIAGNOSIS — M19011 Primary osteoarthritis, right shoulder: Secondary | ICD-10-CM | POA: Diagnosis not present

## 2017-12-05 MED ORDER — PREDNISONE 10 MG PO TABS: ORAL_TABLET | ORAL | 0 refills | Status: DC

## 2017-12-05 NOTE — Progress Notes (Signed)
Chief Complaint  Patient presents with  . Shoulder Pain    pt here today c/o right shoulder pain    HPI  Patient presents today for 2 months of increasing right shoulder pain.  He points to the anterior joint space.  Pain is rated 5-7/10 for severity.  It is a dull ache.  It has started swelling and climaxed today.  He has been taking ibuprofen for it periodically which did not help.  He switched to naproxen and that has not helped either.  Patient says that he sleeps on his side and the pain when he rolls over onto his right is such that he will wake him up during the night.  PMH: Smoking status noted ROS: Review of Systems  Constitutional: Negative.  Negative for activity change (Continues to work as a [employer] in spite of the pain).  HENT: Negative.   Respiratory: Negative for shortness of breath.   Cardiovascular: Negative for chest pain.  Musculoskeletal: Positive for arthralgias, joint swelling and myalgias.     Objective: BP 125/84   Pulse 82   Temp (!) 97.1 F (36.2 C) (Oral)   Ht 5\' 9"  (1.753 m)   Wt 225 lb (102.1 kg)   BMI 33.23 kg/m  Gen: NAD, alert, cooperative with exam HEENT: NCAT, EOMI, PERRL CV: RRR, good S1/S2, no murmur Resp: CTABL, no wheezes, non-labored Ext: Patient is tender over the biceps origin of the anterior head.  There is some edema noted in the area.  There is full range of motion but 4/5 strength for flexion right upper extremity. Neuro: Alert and oriented, No gross deficits Right shoulder x-ray: No acute abnormality, normal joint spaces. Assessment and plan:  1. Acute pain of right shoulder     Meds ordered this encounter  Medications  . predniSONE (DELTASONE) 10 MG tablet    Sig: Take 5 daily for 3 days followed by 4,3,2 and 1 for 3 days each.    Dispense:  45 tablet    Refill:  0    Orders Placed This Encounter  Procedures  . DG Shoulder Right    Standing Status:   Future    Number of Occurrences:   1    Standing Expiration Date:    02/04/2019    Order Specific Question:   Reason for Exam (SYMPTOM  OR DIAGNOSIS REQUIRED)    Answer:   shoulder pain    Order Specific Question:   Preferred imaging location?    Answer:   Internal    Follow up as needed.  Mechele ClaudeWarren Chaunte Hornbeck, MD

## 2018-02-14 ENCOUNTER — Ambulatory Visit: Payer: BLUE CROSS/BLUE SHIELD | Admitting: Family Medicine

## 2018-02-14 ENCOUNTER — Encounter: Payer: Self-pay | Admitting: Family Medicine

## 2018-02-14 VITALS — BP 121/83 | HR 81 | Temp 97.5°F | Ht 69.0 in | Wt 223.0 lb

## 2018-02-14 DIAGNOSIS — R0683 Snoring: Secondary | ICD-10-CM

## 2018-02-14 DIAGNOSIS — G43009 Migraine without aura, not intractable, without status migrainosus: Secondary | ICD-10-CM | POA: Diagnosis not present

## 2018-02-14 MED ORDER — SUMATRIPTAN SUCCINATE 50 MG PO TABS: 50.0000 mg | ORAL_TABLET | Freq: Once | ORAL | 0 refills | Status: DC

## 2018-02-14 MED ORDER — PROPRANOLOL HCL 20 MG PO TABS: 20.0000 mg | ORAL_TABLET | Freq: Three times a day (TID) | ORAL | 0 refills | Status: DC
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
## 2018-02-14 NOTE — Patient Instructions (Signed)
I am placing on migraine prevention medication.  The medication is called propranolol.  Start out by taking 1 tablet twice a day for the next week.  If you are tolerating this without difficulty, you can increase to 3 times a day.  Monitor your blood pressure while you are on this medicine.  We discussed that this may slightly lower your blood pressure and pulse.  If you are experiencing dizziness or lightheadedness, please contact her office.  I have also prescribed you Imitrex to use at the onset of severe migraine headache not relieved by naproxen or ibuprofen.  As we discussed, you may repeat the dose one time in 2 hours if you have persistent migraine symptoms.  Maintain a headache diary like we discussed.  Follow-up with me in 1 month or sooner if needed.  I have placed a referral to the sleep specialist for evaluation of your snoring.   Sleep Apnea Sleep apnea is a condition in which breathing pauses or becomes shallow during sleep. Episodes of sleep apnea usually last 10 seconds or longer, and they may occur as many as 20 times an hour. Sleep apnea disrupts your sleep and keeps your body from getting the rest that it needs. This condition can increase your risk of certain health problems, including:  Heart attack.  Stroke.  Obesity.  Diabetes.  Heart failure.  Irregular heartbeat.  There are three kinds of sleep apnea:  Obstructive sleep apnea. This kind is caused by a blocked or collapsed airway.  Central sleep apnea. This kind happens when the part of the brain that controls breathing does not send the correct signals to the muscles that control breathing.  Mixed sleep apnea. This is a combination of obstructive and central sleep apnea.  What are the causes? The most common cause of this condition is a collapsed or blocked airway. An airway can collapse or become blocked if:  Your throat muscles are abnormally relaxed.  Your tongue and tonsils are larger than  normal.  You are overweight.  Your airway is smaller than normal.  What increases the risk? This condition is more likely to develop in people who:  Are overweight.  Smoke.  Have a smaller than normal airway.  Are elderly.  Are male.  Drink alcohol.  Take sedatives or tranquilizers.  Have a family history of sleep apnea.  What are the signs or symptoms? Symptoms of this condition include:  Trouble staying asleep.  Daytime sleepiness and tiredness.  Irritability.  Loud snoring.  Morning headaches.  Trouble concentrating.  Forgetfulness.  Decreased interest in sex.  Unexplained sleepiness.  Mood swings.  Personality changes.  Feelings of depression.  Waking up often during the night to urinate.  Dry mouth.  Sore throat.  How is this diagnosed? This condition may be diagnosed with:  A medical history.  A physical exam.  A series of tests that are done while you are sleeping (sleep study). These tests are usually done in a sleep lab, but they may also be done at home.  How is this treated? Treatment for this condition aims to restore normal breathing and to ease symptoms during sleep. It may involve managing health issues that can affect breathing, such as high blood pressure or obesity. Treatment may include:  Sleeping on your side.  Using a decongestant if you have nasal congestion.  Avoiding the use of depressants, including alcohol, sedatives, and narcotics.  Losing weight if you are overweight.  Making changes to your diet.  Quitting  smoking.  Using a device to open your airway while you sleep, such as: ? An oral appliance. This is a custom-made mouthpiece that shifts your lower jaw forward. ? A continuous positive airway pressure (CPAP) device. This device delivers oxygen to your airway through a mask. ? A nasal expiratory positive airway pressure (EPAP) device. This device has valves that you put into each nostril. ? A bi-level  positive airway pressure (BPAP) device. This device delivers oxygen to your airway through a mask.  Surgery if other treatments do not work. During surgery, excess tissue is removed to create a wider airway.  It is important to get treatment for sleep apnea. Without treatment, this condition can lead to:  High blood pressure.  Coronary artery disease.  (Men) An inability to achieve or maintain an erection (impotence).  Reduced thinking abilities.  Follow these instructions at home:  Make any lifestyle changes that your health care provider recommends.  Eat a healthy, well-balanced diet.  Take over-the-counter and prescription medicines only as told by your health care provider.  Avoid using depressants, including alcohol, sedatives, and narcotics.  Take steps to lose weight if you are overweight.  If you were given a device to open your airway while you sleep, use it only as told by your health care provider.  Do not use any tobacco products, such as cigarettes, chewing tobacco, and e-cigarettes. If you need help quitting, ask your health care provider.  Keep all follow-up visits as told by your health care provider. This is important. Contact a health care provider if:  The device that you received to open your airway during sleep is uncomfortable or does not seem to be working.  Your symptoms do not improve.  Your symptoms get worse. Get help right away if:  You develop chest pain.  You develop shortness of breath.  You develop discomfort in your back, arms, or stomach.  You have trouble speaking.  You have weakness on one side of your body.  You have drooping in your face. These symptoms may represent a serious problem that is an emergency. Do not wait to see if the symptoms will go away. Get medical help right away. Call your local emergency services (911 in the [place] Do not drive yourself to the hospital. This information is not intended to replace advice  given to you by your health care provider. Make sure you discuss any questions you have with your health care provider. Document Released: 05/28/2002 Document Revised: 02/01/2016 Document Reviewed: 03/17/2015 Elsevier Interactive Patient Education  2018 [employer].  Migraine Headache A migraine headache is a very strong throbbing pain on one side or both sides of your head. Migraines can also cause other symptoms. Talk with your doctor about what things may bring on (trigger) your migraine headaches. Follow these instructions at home: Medicines  Take over-the-counter and prescription medicines only as told by your doctor.  Do not drive or use heavy machinery while taking prescription pain medicine.  To prevent or treat constipation while you are taking prescription pain medicine, your doctor may recommend that you: ? Drink enough fluid to keep your pee (urine) clear or pale yellow. ? Take over-the-counter or prescription medicines. ? Eat foods that are high in fiber. These include fresh fruits and vegetables, whole grains, and beans. ? Limit foods that are high in fat and processed sugars. These include fried and sweet foods. Lifestyle  Avoid alcohol.  Do not use any products that contain nicotine or tobacco, such  as cigarettes and e-cigarettes. If you need help quitting, ask your doctor.  Get at least 8 hours of sleep every night.  Limit your stress. General instructions   Keep a journal to find out what may bring on your migraines. For example, write down: ? What you eat and drink. ? How much sleep you get. ? Any change in what you eat or drink. ? Any change in your medicines.  If you have a migraine: ? Avoid things that make your symptoms worse, such as bright lights. ? It may help to lie down in a dark, quiet room. ? Do not drive or use heavy machinery. ? Ask your doctor what activities are safe for you.  Keep all follow-up visits as told by your doctor. This is  important. Contact a doctor if:  You get a migraine that is different or worse than your usual migraines. Get help right away if:  Your migraine gets very bad.  You have a fever.  You have a stiff neck.  You have trouble seeing.  Your muscles feel weak or like you cannot control them.  You start to lose your balance a lot.  You start to have trouble walking.  You pass out (faint). This information is not intended to replace advice given to you by your health care provider. Make sure you discuss any questions you have with your health care provider. Document Released: 03/16/2008 Document Revised: 12/26/2015 Document Reviewed: 11/24/2015 Elsevier Interactive Patient Education  2018 [employer].

## 2018-03-09 ENCOUNTER — Ambulatory Visit: Payer: BLUE CROSS/BLUE SHIELD | Admitting: Family

## 2018-03-09 DIAGNOSIS — K5792 Diverticulitis of intestine, part unspecified, without perforation or abscess without bleeding: Secondary | ICD-10-CM | POA: Diagnosis not present

## 2018-03-09 DIAGNOSIS — Z6827 Body mass index (BMI) 27.0-27.9, adult: Secondary | ICD-10-CM | POA: Diagnosis not present

## 2018-03-17 ENCOUNTER — Ambulatory Visit: Payer: BLUE CROSS/BLUE SHIELD | Admitting: Family Medicine

## 2018-03-23 ENCOUNTER — Encounter: Payer: Self-pay | Admitting: Family

## 2018-03-23 ENCOUNTER — Ambulatory Visit: Payer: BLUE CROSS/BLUE SHIELD | Admitting: Family

## 2018-03-23 VITALS — BP 114/82 | HR 115 | Temp 97.4°F | Ht 69.0 in | Wt 218.8 lb

## 2018-03-23 DIAGNOSIS — E860 Dehydration: Secondary | ICD-10-CM | POA: Diagnosis not present

## 2018-03-23 DIAGNOSIS — R197 Diarrhea, unspecified: Secondary | ICD-10-CM

## 2018-03-23 NOTE — Patient Instructions (Signed)
Dehydration, Adult Dehydration is a condition in which there is not enough fluid or water in the body. This happens when you lose more fluids than you take in. Important organs, such as the kidneys, brain, and heart, cannot function without a proper amount of fluids. Any loss of fluids from the body can lead to dehydration. Dehydration can range from mild to severe. This condition should be treated right away to prevent it from becoming severe. What are the causes? This condition may be caused by:  Vomiting.  Diarrhea.  Excessive sweating, such as from heat exposure or exercise.  Not drinking enough fluid, especially: ? When ill. ? While doing activity that requires a lot of energy.  Excessive urination.  Fever.  Infection.  Certain medicines, such as medicines that cause the body to lose excess fluid (diuretics).  Inability to access safe drinking water.  Reduced physical ability to get adequate water and food.  What increases the risk? This condition is more likely to develop in people:  Who have a poorly controlled long-term (chronic) illness, such as diabetes, heart disease, or kidney disease.  Who are age 65 or older.  Who are disabled.  Who live in a place with high altitude.  Who play endurance sports.  What are the signs or symptoms? Symptoms of mild dehydration may include:  Thirst.  Dry lips.  Slightly dry mouth.  Dry, warm skin.  Dizziness. Symptoms of moderate dehydration may include:  Very dry mouth.  Muscle cramps.  Dark urine. Urine may be the color of tea.  Decreased urine production.  Decreased tear production.  Heartbeat that is irregular or faster than normal (palpitations).  Headache.  Light-headedness, especially when you stand up from a sitting position.  Fainting (syncope). Symptoms of severe dehydration may include:  Changes in skin, such as: ? Cold and clammy skin. ? Blotchy (mottled) or pale skin. ? Skin that does  not quickly return to normal after being lightly pinched and released (poor skin turgor).  Changes in body fluids, such as: ? Extreme thirst. ? No tear production. ? Inability to sweat when body temperature is high, such as in hot weather. ? Very little urine production.  Changes in vital signs, such as: ? Weak pulse. ? Pulse that is more than 100 beats a minute when sitting still. ? Rapid breathing. ? Low blood pressure.  Other changes, such as: ? Sunken eyes. ? Cold hands and feet. ? Confusion. ? Lack of energy (lethargy). ? Difficulty waking up from sleep. ? Short-term weight loss. ? Unconsciousness. How is this diagnosed? This condition is diagnosed based on your symptoms and a physical exam. Blood and urine tests may be done to help confirm the diagnosis. How is this treated? Treatment for this condition depends on the severity. Mild or moderate dehydration can often be treated at home. Treatment should be started right away. Do not wait until dehydration becomes severe. Severe dehydration is an emergency and it needs to be treated in a hospital. Treatment for mild dehydration may include:  Drinking more fluids.  Replacing salts and minerals in your blood (electrolytes) that you may have lost. Treatment for moderate dehydration may include:  Drinking an oral rehydration solution (ORS). This is a drink that helps you replace fluids and electrolytes (rehydrate). It can be found at pharmacies and retail stores. Treatment for severe dehydration may include:  Receiving fluids through an IV tube.  Receiving an electrolyte solution through a feeding tube that is passed through your nose   and into your stomach (nasogastric tube, or NG tube).  Correcting any abnormalities in electrolytes.  Treating the underlying cause of dehydration. Follow these instructions at home:  If directed by your health care provider, drink an ORS: ? Make an ORS by following instructions on the  package. ? Start by drinking small amounts, about  cup (120 mL) every 5-10 minutes. ? Slowly increase how much you drink until you have taken the amount recommended by your health care provider.  Drink enough clear fluid to keep your urine clear or pale yellow. If you were told to drink an ORS, finish the ORS first, then start slowly drinking other clear fluids. Drink fluids such as: ? Water. Do not drink only water. Doing that can lead to having too little salt (sodium) in the body (hyponatremia). ? Ice chips. ? Fruit juice that you have added water to (diluted fruit juice). ? Low-calorie sports drinks.  Avoid: ? Alcohol. ? Drinks that contain a lot of sugar. These include high-calorie sports drinks, fruit juice that is not diluted, and soda. ? Caffeine. ? Foods that are greasy or contain a lot of fat or sugar.  Take over-the-counter and prescription medicines only as told by your health care provider.  Do not take sodium tablets. This can lead to having too much sodium in the body (hypernatremia).  Eat foods that contain a healthy balance of electrolytes, such as bananas, oranges, potatoes, tomatoes, and spinach.  Keep all follow-up visits as told by your health care provider. This is important. Contact a health care provider if:  You have abdominal pain that: ? Gets worse. ? Stays in one area (localizes).  You have a rash.  You have a stiff neck.  You are more irritable than usual.  You are sleepier or more difficult to wake up than usual.  You feel weak or dizzy.  You feel very thirsty.  You have urinated only a small amount of very dark urine over 6-8 hours. Get help right away if:  You have symptoms of severe dehydration.  You cannot drink fluids without vomiting.  Your symptoms get worse with treatment.  You have a fever.  You have a severe headache.  You have vomiting or diarrhea that: ? Gets worse. ? Does not go away.  You have blood or green matter  (bile) in your vomit.  You have blood in your stool. This may cause stool to look black and tarry.  You have not urinated in 6-8 hours.  You faint.  Your heart rate while sitting still is over 100 beats a minute.  You have trouble breathing. This information is not intended to replace advice given to you by your health care provider. Make sure you discuss any questions you have with your health care provider. Document Released: 06/07/2005 Document Revised: 01/02/2016 Document Reviewed: 08/01/2015 Elsevier Interactive Patient Education  2018 [employer] Inc[employer]    Diarrhea, Adult Diarrhea is frequent loose and watery bowel movements. Diarrhea can make you feel weak and cause you to become dehydrated. Dehydration can make you tired and thirsty, cause you to have a dry mouth, and decrease how often you urinate. Diarrhea typically lasts 2-3 days. However, it can last longer if it is a sign of something more serious. It is important to treat your diarrhea as told by your health care provider. Follow these instructions at home: Eating and drinking  Follow these recommendations as told by your health care provider:  Take an oral rehydration solution (ORS). This   is a drink that is sold at pharmacies and retail stores.  Drink clear fluids, such as water, ice chips, diluted fruit juice, and low-calorie sports drinks.  Eat bland, easy-to-digest foods in small amounts as you are able. These foods include bananas, applesauce, rice, lean meats, toast, and crackers.  Avoid drinking fluids that contain a lot of sugar or caffeine, such as energy drinks, sports drinks, and soda.  Avoid alcohol.  Avoid spicy or fatty foods.  General instructions  Drink enough fluid to keep your urine clear or pale yellow.  Wash your hands often. If soap and water are not available, use hand sanitizer.  Make sure that all people in your household wash their hands well and often.  Take over-the-counter and  prescription medicines only as told by your health care provider.  Rest at home while you recover.  Watch your condition for any changes.  Take a warm bath to relieve any burning or pain from frequent diarrhea episodes.  Keep all follow-up visits as told by your health care provider. This is important. Contact a health care provider if:  You have a fever.  Your diarrhea gets worse.  You have new symptoms.  You cannot keep fluids down.  You feel light-headed or dizzy.  You have a headache  You have muscle cramps. Get help right away if:  You have chest pain.  You feel extremely weak or you faint.  You have bloody or black stools or stools that look like tar.  You have severe pain, cramping, or bloating in your abdomen.  You have trouble breathing or you are breathing very quickly.  Your heart is beating very quickly.  Your skin feels cold and clammy.  You feel confused.  You have signs of dehydration, such as: ? Dark urine, very little urine, or no urine. ? Cracked lips. ? Dry mouth. ? Sunken eyes. ? Sleepiness. ? Weakness. This information is not intended to replace advice given to you by your health care provider. Make sure you discuss any questions you have with your health care provider. Document Released: 05/28/2002 Document Revised: 10/16/2015 Document Reviewed: 02/11/2015 Elsevier Interactive Patient Education  2018 Elsevier Inc.  

## 2018-03-23 NOTE — Progress Notes (Signed)
   Subjective:    Patient ID: Francisco [name], male    DOB: 1970-06-30, 47 y.o.   MRN: [id number]  Chief Complaint  Patient presents with  . Diarrhea    sweaty,weakness  . Fever    Diarrhea   This is a new problem. The current episode started more than 1 month ago. The problem occurs more than 10 times per day. The problem has been gradually worsening. The stool consistency is described as watery. The patient states that diarrhea awakens him from sleep. Associated symptoms include bloating, chills, a fever and headaches. Pertinent negatives include no vomiting. Risk factors include recent antibiotic use. He has tried anti-motility drug for the symptoms. The treatment provided mild relief.      Review of Systems  Constitutional: Positive for chills and fever.  Gastrointestinal: Positive for bloating and diarrhea. Negative for vomiting.  Neurological: Positive for headaches.  All other systems reviewed and are negative.      Objective:   Physical Exam  Constitutional: He is oriented to person, place, and time. He appears well-developed and well-nourished. No distress.  HENT:  Head: Normocephalic.  Eyes: Pupils are equal, round, and reactive to light. Right eye exhibits no discharge. Left eye exhibits no discharge.  Neck: Normal range of motion. Neck supple. No thyromegaly present.  Cardiovascular: Normal rate, regular rhythm, normal heart sounds and intact distal pulses.  No murmur heard. Pulmonary/Chest: Effort normal and breath sounds normal. No respiratory distress. He has no wheezes.  Abdominal: Soft. Bowel sounds are normal. He exhibits no distension. There is generalized tenderness (mild).  Musculoskeletal: Normal range of motion. He exhibits no edema or tenderness.  Neurological: He is alert and oriented to person, place, and time. He has normal reflexes. No cranial nerve deficit.  Skin: Skin is warm. No rash noted. He is diaphoretic. No erythema.  Psychiatric: He has a  normal mood and affect. His behavior is normal. Judgment and thought content normal.  Vitals reviewed.     BP 114/82   Pulse (!) 115   Temp (!) 97.4 F (36.3 C) (Oral)   Ht '5\' 9"'$  (1.753 m)   Wt 218 lb 12.8 oz (99.2 kg)   BMI 32.31 kg/m      Assessment & Plan:  [name] comes in today with chief complaint of Diarrhea (sweaty,weakness) and Fever   Diagnosis and orders addressed:  1. Diarrhea, unspecified type Bland diet Get stool samples and imodium as needed   - Cdiff NAA+O+P+Stool Culture - CBC with Differential/Platelet - CMP14+EGFR  2. Dehydration Force fluids  - Cdiff NAA+O+P+Stool Culture - CBC with Differential/Platelet - CMP14+EGFR   Labs pending If abdominal pain worsens go to ED, CBC pending  Follow up plan: 1 week    Evelina Dun, FNP

## 2018-03-24 ENCOUNTER — Other Ambulatory Visit: Payer: BLUE CROSS/BLUE SHIELD

## 2018-03-24 DIAGNOSIS — R197 Diarrhea, unspecified: Secondary | ICD-10-CM | POA: Diagnosis not present

## 2018-03-24 DIAGNOSIS — E860 Dehydration: Secondary | ICD-10-CM | POA: Diagnosis not present

## 2018-03-24 LAB — CMP14+EGFR
ALT: 25 IU/L (ref 0–44)
AST: 19 IU/L (ref 0–40)
Albumin/Globulin Ratio: 2.2 (ref 1.2–2.2)
Albumin: 4.6 g/dL (ref 3.5–5.5)
Alkaline Phosphatase: 103 IU/L (ref 39–117)
BUN/Creatinine Ratio: 10 (ref 9–20)
BUN: 10 mg/dL (ref 6–24)
Bilirubin Total: 0.2 mg/dL (ref 0.0–1.2)
CALCIUM: 9.5 mg/dL (ref 8.7–10.2)
CO2: 20 mmol/L (ref 20–29)
CREATININE: 0.99 mg/dL (ref 0.76–1.27)
Chloride: 106 mmol/L (ref 96–106)
GFR calc Af Amer: 106 mL/min/{1.73_m2} (ref >59)
GFR, EST NON AFRICAN AMERICAN: 92 mL/min/{1.73_m2} (ref >59)
GLOBULIN, TOTAL: 2.1 g/dL (ref 1.5–4.5)
Glucose: 105 mg/dL — ABNORMAL HIGH (ref 65–99)
Potassium: 3.9 mmol/L (ref 3.5–5.2)
Sodium: 142 mmol/L (ref 134–144)
Total Protein: 6.7 g/dL (ref 6.0–8.5)

## 2018-03-24 LAB — CBC WITH DIFFERENTIAL/PLATELET
Basophils Absolute: 0.1 10*3/uL (ref 0.0–0.2)
Basos: 1 %
EOS (ABSOLUTE): 1 10*3/uL — ABNORMAL HIGH (ref 0.0–0.4)
EOS: 10 %
HEMATOCRIT: 43.1 % (ref 37.5–51.0)
Hemoglobin: 14.5 g/dL (ref 13.0–17.7)
Immature Grans (Abs): 0 10*3/uL (ref 0.0–0.1)
Immature Granulocytes: 0 %
LYMPHS: 14 %
Lymphocytes Absolute: 1.4 10*3/uL (ref 0.7–3.1)
MCH: 31.3 pg (ref 26.6–33.0)
MCHC: 33.6 g/dL (ref 31.5–35.7)
MCV: 93 fL (ref 79–97)
MONOCYTES: 7 %
Monocytes Absolute: 0.7 10*3/uL (ref 0.1–0.9)
Neutrophils Absolute: 6.8 10*3/uL (ref 1.4–7.0)
Neutrophils: 68 %
PLATELETS: 315 10*3/uL (ref 150–450)
RBC: 4.63 x10E6/uL (ref 4.14–5.80)
RDW: 13.3 % (ref 12.3–15.4)
WBC: 10 10*3/uL (ref 3.4–10.8)

## 2018-03-28 ENCOUNTER — Telehealth: Payer: Self-pay | Admitting: Family Medicine

## 2018-03-28 DIAGNOSIS — R197 Diarrhea, unspecified: Secondary | ICD-10-CM

## 2018-03-28 MED ORDER — CIPROFLOXACIN HCL 500 MG PO TABS: 500.0000 mg | ORAL_TABLET | Freq: Two times a day (BID) | ORAL | 0 refills | Status: DC

## 2018-03-28 NOTE — Telephone Encounter (Signed)
Patient aware and verbalizes understanding. 

## 2018-03-28 NOTE — Telephone Encounter (Signed)
Patient aware and verbalizes understanding about lab results. States that diarrhea is no better and would like to know what is the next steps.

## 2018-03-28 NOTE — Telephone Encounter (Signed)
I have sent in Cipro 500 mg two tablets twice a day for five days. I also did a GI referral .

## 2018-03-29 LAB — CDIFF NAA+O+P+STOOL CULTURE
E coli, Shiga toxin Assay: NEGATIVE
Toxigenic C. Difficile by PCR: NEGATIVE

## 2018-03-30 ENCOUNTER — Telehealth: Payer: Self-pay | Admitting: Family Medicine

## 2018-03-30 NOTE — Telephone Encounter (Signed)
Patient aware that stool cultures were negative per christy.  He reports he has an appointment with GI on Monday.

## 2018-04-03 ENCOUNTER — Ambulatory Visit (INDEPENDENT_AMBULATORY_CARE_PROVIDER_SITE_OTHER): Payer: BLUE CROSS/BLUE SHIELD | Admitting: Internal Medicine

## 2018-04-03 ENCOUNTER — Encounter (INDEPENDENT_AMBULATORY_CARE_PROVIDER_SITE_OTHER): Payer: Self-pay | Admitting: Internal Medicine

## 2018-04-03 VITALS — BP 158/90 | HR 76 | Temp 98.1°F | Ht 69.0 in | Wt 223.6 lb

## 2018-04-03 DIAGNOSIS — A09 Infectious gastroenteritis and colitis, unspecified: Secondary | ICD-10-CM | POA: Diagnosis not present

## 2018-04-03 DIAGNOSIS — K5732 Diverticulitis of large intestine without perforation or abscess without bleeding: Secondary | ICD-10-CM

## 2018-04-03 NOTE — Patient Instructions (Addendum)
CBC. And GI pathogen.  Diverticulitis Diverticulitis is when small pockets in your large intestine (colon) get infected or swollen. This causes stomach pain and watery poop (diarrhea). These pouches are called diverticula. They form in people who have a condition called diverticulosis. Follow these instructions at home: Medicines  Take over-the-counter and prescription medicines only as told by your doctor. These include: ? Antibiotics. ? Pain medicines. ? Fiber pills. ? Probiotics. ? Stool softeners.  Do not drive or use heavy machinery while taking prescription pain medicine.  If you were prescribed an antibiotic, take it as told. Do not stop taking it even if you feel better. General instructions  Follow a diet as told by your doctor.  When you feel better, your doctor may tell you to change your diet. You may need to eat a lot of fiber. Fiber makes it easier to poop (have bowel movements). Healthy foods with fiber include: ? Berries. ? Beans. ? Lentils. ? Green vegetables.  Exercise 3 or more times a week. Aim for 30 minutes each time. Exercise enough to sweat and make your heart beat faster.  Keep all follow-up visits as told. This is important. You may need to have an exam of the large intestine. This is called a colonoscopy. Contact a doctor if:  Your pain does not get better.  You have a hard time eating or drinking.  You are not pooping like normal. Get help right away if:  Your pain gets worse.  Your problems do not get better.  Your problems get worse very fast.  You have a fever.  You throw up (vomit) more than one time.  You have poop that is: ? Bloody. ? Black. ? Tarry. Summary  Diverticulitis is when small pockets in your large intestine (colon) get infected or swollen.  Take medicines only as told by your doctor.  Follow a diet as told by your doctor. This information is not intended to replace advice given to you by your health care  provider. Make sure you discuss any questions you have with your health care provider. Document Released: 11/24/2007 Document Revised: 06/24/2016 Document Reviewed: 06/24/2016 Elsevier Interactive Patient Education  2017 [employer].
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
## 2018-04-04 DIAGNOSIS — K5732 Diverticulitis of large intestine without perforation or abscess without bleeding: Secondary | ICD-10-CM | POA: Diagnosis not present

## 2018-04-04 DIAGNOSIS — A09 Infectious gastroenteritis and colitis, unspecified: Secondary | ICD-10-CM | POA: Diagnosis not present

## 2018-04-05 ENCOUNTER — Telehealth (INDEPENDENT_AMBULATORY_CARE_PROVIDER_SITE_OTHER): Payer: Self-pay | Admitting: Internal Medicine

## 2018-04-05 ENCOUNTER — Encounter (INDEPENDENT_AMBULATORY_CARE_PROVIDER_SITE_OTHER): Payer: Self-pay

## 2018-04-05 DIAGNOSIS — K625 Hemorrhage of anus and rectum: Secondary | ICD-10-CM

## 2018-04-05 DIAGNOSIS — A09 Infectious gastroenteritis and colitis, unspecified: Secondary | ICD-10-CM

## 2018-04-05 MED ORDER — HYDROCORTISONE ACE-PRAMOXINE 1-1 % RE FOAM: 1.0000 | Freq: Two times a day (BID) | RECTAL | 0 refills | Status: DC

## 2018-04-05 MED ORDER — AMOXICILLIN-POT CLAVULANATE 875-125 MG PO TABS: 1.0000 | ORAL_TABLET | Freq: Two times a day (BID) | ORAL | 0 refills | Status: DC

## 2018-04-05 NOTE — Telephone Encounter (Signed)
States he started having some rectal bleeding yesterday. WBC ct is slightly elevated. Was going to call in Augmentin but he requested Proctofoam. I have sent an Rx for Proctofoam to his pharmacy

## 2018-04-05 NOTE — Telephone Encounter (Signed)
Rx for Augmentin sent to his pharmacy 

## 2018-04-06 LAB — CBC WITH DIFFERENTIAL/PLATELET
BASOS PCT: 0.4 %
Basophils Absolute: 50 cells/uL (ref 0–200)
EOS ABS: 0 {cells}/uL — AB (ref 15–500)
Eosinophils Relative: 0 %
HEMATOCRIT: 42.1 % (ref 38.5–50.0)
HEMOGLOBIN: 14.4 g/dL (ref 13.2–17.1)
Lymphs Abs: 706 cells/uL — ABNORMAL LOW (ref 850–3900)
MCH: 31.2 pg (ref 27.0–33.0)
MCHC: 34.2 g/dL (ref 32.0–36.0)
MCV: 91.1 fL (ref 80.0–100.0)
MPV: 9.6 fL (ref 7.5–12.5)
Monocytes Relative: 1.8 %
NEUTROS ABS: 11617 {cells}/uL — AB (ref 1500–7800)
Neutrophils Relative %: 92.2 %
PLATELETS: 354 10*3/uL (ref 140–400)
RBC: 4.62 10*6/uL (ref 4.20–5.80)
RDW: 13 % (ref 11.0–15.0)
TOTAL LYMPHOCYTE: 5.6 %
WBC: 12.6 10*3/uL — AB (ref 3.8–10.8)
WBCMIX: 227 {cells}/uL (ref 200–950)

## 2018-04-06 LAB — GASTROINTESTINAL PATHOGEN PANEL PCR
C. difficile Tox A/B, PCR: DETECTED — AB
Campylobacter, PCR: NOT DETECTED
Cryptosporidium, PCR: NOT DETECTED
E COLI (ETEC) LT/ST, PCR: NOT DETECTED
E COLI (STEC) STX1/STX2, PCR: NOT DETECTED
E coli 0157, PCR: NOT DETECTED
GIARDIA LAMBLIA, PCR: NOT DETECTED
NOROVIRUS, PCR: NOT DETECTED
ROTAVIRUS, PCR: NOT DETECTED
SHIGELLA, PCR: NOT DETECTED
Salmonella, PCR: NOT DETECTED

## 2018-04-07 ENCOUNTER — Telehealth (INDEPENDENT_AMBULATORY_CARE_PROVIDER_SITE_OTHER): Payer: Self-pay | Admitting: Internal Medicine

## 2018-04-07 ENCOUNTER — Ambulatory Visit: Payer: BLUE CROSS/BLUE SHIELD | Admitting: Family Medicine

## 2018-04-07 DIAGNOSIS — A0472 Enterocolitis due to Clostridium difficile, not specified as recurrent: Secondary | ICD-10-CM

## 2018-04-07 MED ORDER — VANCOMYCIN HCL 125 MG PO CAPS: 125.0000 mg | ORAL_CAPSULE | Freq: Four times a day (QID) | ORAL | 0 refills | Status: DC

## 2018-04-07 NOTE — Telephone Encounter (Signed)
err

## 2018-04-07 NOTE — Telephone Encounter (Signed)
Patient has C diff. Rx called to his pharmacy for Vancomycin

## 2018-04-10 ENCOUNTER — Encounter (INDEPENDENT_AMBULATORY_CARE_PROVIDER_SITE_OTHER): Payer: Self-pay | Admitting: *Deleted

## 2018-04-11 ENCOUNTER — Institutional Professional Consult (permissible substitution): Payer: BLUE CROSS/BLUE SHIELD | Admitting: Neurology

## 2018-04-11 ENCOUNTER — Telehealth: Payer: Self-pay | Admitting: Neurology

## 2018-04-11 NOTE — Telephone Encounter (Signed)
Patient was no show to apt today for sleep consult. 

## 2018-04-12 ENCOUNTER — Encounter: Payer: Self-pay | Admitting: Neurology

## 2018-04-23 ENCOUNTER — Encounter (INDEPENDENT_AMBULATORY_CARE_PROVIDER_SITE_OTHER): Payer: Self-pay

## 2018-04-24 ENCOUNTER — Telehealth (INDEPENDENT_AMBULATORY_CARE_PROVIDER_SITE_OTHER): Payer: Self-pay | Admitting: Internal Medicine

## 2018-04-24 DIAGNOSIS — R1032 Left lower quadrant pain: Secondary | ICD-10-CM

## 2018-04-24 NOTE — Telephone Encounter (Signed)
Ann, CT. I have spoken with patient.

## 2018-04-24 NOTE — Telephone Encounter (Signed)
CT sch'd for 04/25/18 at 2 (145), patient aware

## 2018-04-25 ENCOUNTER — Ambulatory Visit (HOSPITAL_COMMUNITY)
Admission: RE | Admit: 2018-04-25 | Discharge: 2018-04-25 | Disposition: A | Discharge status: Home / Self Care | Payer: BLUE CROSS/BLUE SHIELD | Source: Ambulatory Visit | Attending: Internal Medicine | Admitting: Internal Medicine

## 2018-04-25 DIAGNOSIS — R1032 Left lower quadrant pain: Secondary | ICD-10-CM | POA: Diagnosis not present

## 2018-04-25 DIAGNOSIS — R197 Diarrhea, unspecified: Secondary | ICD-10-CM | POA: Diagnosis not present

## 2018-04-25 MED ORDER — IOPAMIDOL (ISOVUE-300) INJECTION 61%
100.0000 mL | Freq: Once | INTRAVENOUS | Status: AC | PRN
  Administered 2018-04-25: 100 mL via INTRAVENOUS

## 2018-04-26 ENCOUNTER — Other Ambulatory Visit (INDEPENDENT_AMBULATORY_CARE_PROVIDER_SITE_OTHER): Payer: Self-pay | Admitting: Internal Medicine

## 2018-04-26 ENCOUNTER — Encounter (INDEPENDENT_AMBULATORY_CARE_PROVIDER_SITE_OTHER): Payer: Self-pay | Admitting: Internal Medicine

## 2018-04-26 DIAGNOSIS — A0472 Enterocolitis due to Clostridium difficile, not specified as recurrent: Secondary | ICD-10-CM

## 2018-04-26 MED ORDER — VANCOMYCIN HCL 125 MG PO CAPS: 125.0000 mg | ORAL_CAPSULE | Freq: Four times a day (QID) | ORAL | 0 refills | Status: DC

## 2018-04-26 NOTE — Telephone Encounter (Signed)
Rx for Vancomycin sent to his pharmacy 

## 2018-07-17 ENCOUNTER — Encounter: Payer: Self-pay | Admitting: Family Medicine

## 2018-07-18 ENCOUNTER — Other Ambulatory Visit: Payer: Self-pay | Admitting: Family Medicine

## 2018-07-18 MED ORDER — VARENICLINE TARTRATE 1 MG PO TABS: 1.0000 mg | ORAL_TABLET | Freq: Two times a day (BID) | ORAL | 2 refills | Status: DC

## 2018-07-18 MED ORDER — VARENICLINE TARTRATE 0.5 MG X 11 & 1 MG X 42 PO MISC: ORAL | 0 refills | Status: DC

## 2018-07-25 DIAGNOSIS — Z6832 Body mass index (BMI) 32.0-32.9, adult: Secondary | ICD-10-CM | POA: Diagnosis not present

## 2018-07-25 DIAGNOSIS — S39012A Strain of muscle, fascia and tendon of lower back, initial encounter: Secondary | ICD-10-CM | POA: Diagnosis not present

## 2018-08-22 DIAGNOSIS — R6889 Other general symptoms and signs: Secondary | ICD-10-CM | POA: Diagnosis not present

## 2018-08-22 DIAGNOSIS — R5081 Fever presenting with conditions classified elsewhere: Secondary | ICD-10-CM | POA: Diagnosis not present

## 2018-08-22 DIAGNOSIS — J101 Influenza due to other identified influenza virus with other respiratory manifestations: Secondary | ICD-10-CM | POA: Diagnosis not present

## 2018-08-22 DIAGNOSIS — R05 Cough: Secondary | ICD-10-CM | POA: Diagnosis not present

## 2018-09-27 ENCOUNTER — Telehealth: Payer: Self-pay | Admitting: Family Medicine

## 2018-09-27 ENCOUNTER — Other Ambulatory Visit: Payer: Self-pay

## 2018-09-27 ENCOUNTER — Ambulatory Visit (INDEPENDENT_AMBULATORY_CARE_PROVIDER_SITE_OTHER): Payer: BLUE CROSS/BLUE SHIELD | Admitting: Family Medicine

## 2018-09-27 DIAGNOSIS — J069 Acute upper respiratory infection, unspecified: Secondary | ICD-10-CM

## 2018-09-27 DIAGNOSIS — B9789 Other viral agents as the cause of diseases classified elsewhere: Secondary | ICD-10-CM

## 2018-09-27 DIAGNOSIS — J9801 Acute bronchospasm: Secondary | ICD-10-CM | POA: Diagnosis not present

## 2018-09-27 DIAGNOSIS — R6889 Other general symptoms and signs: Secondary | ICD-10-CM | POA: Diagnosis not present

## 2018-09-27 DIAGNOSIS — Z20822 Contact with and (suspected) exposure to covid-19: Secondary | ICD-10-CM

## 2018-09-27 MED ORDER — PREDNISONE 10 MG (21) PO TBPK: ORAL_TABLET | ORAL | 0 refills | Status: DC

## 2018-09-27 MED ORDER — BENZONATATE 100 MG PO CAPS: 100.0000 mg | ORAL_CAPSULE | Freq: Three times a day (TID) | ORAL | 0 refills | Status: DC | PRN

## 2018-09-27 MED ORDER — ALBUTEROL SULFATE HFA 108 (90 BASE) MCG/ACT IN AERS: 2.0000 | INHALATION_SPRAY | Freq: Four times a day (QID) | RESPIRATORY_TRACT | 0 refills | Status: DC | PRN

## 2018-09-27 NOTE — Patient Instructions (Signed)
I discussed with the patient that COVID-19 infection cannot be ruled out at this time.  Even those with mild symptoms can in fact be infected with the coronavirus.  I have recommended per CDC guidelines "non-test strategy" that patient proceed with isolation for at least 7 days following symptom onset.  They may return to work once they have been asymptomatic without medications for 3 consecutive days.   Coronavirus (COVID-19) Are you at risk?  Are you at risk for the Coronavirus (COVID-19)?  To be considered HIGH RISK for Coronavirus (COVID-19), you have to meet the following criteria:  . Traveled to [place], [place], [place], [place] or [place]; or in the [place] to [place], [place], [place], or [place]; and have fever, cough, and shortness of breath within the last 2 weeks of travel OR . Been in close contact with a person diagnosed with COVID-19 within the last 2 weeks and have fever, cough, and shortness of breath . IF YOU DO NOT MEET THESE CRITERIA, YOU ARE CONSIDERED LOW RISK FOR COVID-19.  What to do if you are HIGH RISK for COVID-19?  Marland Kitchen If you are having a medical emergency, call 911. . Seek medical care right away. Before you go to a doctor's office, urgent care or emergency department, call ahead and tell them about your recent travel, contact with someone diagnosed with COVID-19, and your symptoms. You should receive instructions from your physician's office regarding next steps of care.  . When you arrive at healthcare provider, tell the healthcare staff immediately you have returned from visiting [place], [place], [place], [place] or [place]; or traveled in the [place] to [place], [place], [place], or [place]; in the last two weeks or you have been in close contact with a person diagnosed with COVID-19 in the last 2 weeks.   . Tell the health care staff about your symptoms: fever, cough and shortness of breath. . After you have been seen by a medical provider,  you will be either: o Tested for (COVID-19) and discharged home on quarantine except to seek medical care if symptoms worsen, and asked to  - Stay home and avoid contact with others until you get your results (4-5 days)  - Avoid travel on public transportation if possible (such as bus, train, or airplane) or o Sent to the Emergency Department by EMS for evaluation, COVID-19 testing, and possible admission depending on your condition and test results.  What to do if you are LOW RISK for COVID-19?  Reduce your risk of any infection by using the same precautions used for avoiding the common cold or flu:  Marland Kitchen Wash your hands often with soap and warm water for at least 20 seconds.  If soap and water are not readily available, use an alcohol-based hand sanitizer with at least 60% alcohol.  . If coughing or sneezing, cover your mouth and nose by coughing or sneezing into the elbow areas of your shirt or coat, into a tissue or into your sleeve (not your hands). . Avoid shaking hands with others and consider head nods or verbal greetings only. . Avoid touching your eyes, nose, or mouth with unwashed hands.  . Avoid close contact with people who are sick. . Avoid places or events with large numbers of people in one location, like concerts or sporting events. . Carefully consider travel plans you have or are making. . If you are planning any travel outside or inside the [place], visit the CDC's Travelers'  Health webpage for the latest health notices. . If you have some symptoms but not all symptoms, continue to monitor at home and seek medical attention if your symptoms worsen. . If you are having a medical emergency, call 911.   ADDITIONAL HEALTHCARE OPTIONS FOR PATIENTS  [facility] e-Visit: [email or web address]         [facility]: [phone]  [facility]: [phone]                   [facility]:  [phone]

## 2018-09-27 NOTE — Telephone Encounter (Signed)
PT wife is calling pt did a telephone visit with Dr Reece Agar earlier, pt is needing a dr note, can we please fax to pt wife Fax [phone]
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
## 2018-09-27 NOTE — Telephone Encounter (Signed)
Note printed and faxed per wifes request

## 2018-10-01 ENCOUNTER — Encounter: Payer: Self-pay | Admitting: Family Medicine

## 2018-10-02 ENCOUNTER — Other Ambulatory Visit: Payer: Self-pay

## 2018-10-02 ENCOUNTER — Telehealth: Payer: Self-pay | Admitting: Family Medicine

## 2018-10-02 ENCOUNTER — Ambulatory Visit (HOSPITAL_COMMUNITY)
Admission: RE | Admit: 2018-10-02 | Discharge: 2018-10-02 | Disposition: A | Discharge status: Home / Self Care | Payer: BLUE CROSS/BLUE SHIELD | Source: Ambulatory Visit | Attending: Family Medicine | Admitting: Family Medicine

## 2018-10-02 ENCOUNTER — Other Ambulatory Visit: Payer: Self-pay | Admitting: Family Medicine

## 2018-10-02 DIAGNOSIS — R0602 Shortness of breath: Secondary | ICD-10-CM

## 2018-10-02 DIAGNOSIS — R000 Tachycardia, unspecified: Secondary | ICD-10-CM | POA: Diagnosis not present

## 2018-10-02 NOTE — Telephone Encounter (Signed)
Pt going to ER per Dr Reece Agar my chart note

## 2018-10-03 ENCOUNTER — Encounter (HOSPITAL_COMMUNITY): Payer: Self-pay | Admitting: Emergency Medicine

## 2018-10-03 ENCOUNTER — Other Ambulatory Visit: Payer: Self-pay

## 2018-10-03 ENCOUNTER — Emergency Department (HOSPITAL_COMMUNITY)
Admission: EM | Admit: 2018-10-03 | Discharge: 2018-10-03 | Disposition: A | Discharge status: Home / Self Care | Payer: BLUE CROSS/BLUE SHIELD | Attending: Emergency Medicine | Admitting: Emergency Medicine

## 2018-10-03 DIAGNOSIS — R0602 Shortness of breath: Secondary | ICD-10-CM | POA: Diagnosis not present

## 2018-10-03 DIAGNOSIS — Z79899 Other long term (current) drug therapy: Secondary | ICD-10-CM | POA: Insufficient documentation

## 2018-10-03 DIAGNOSIS — J9801 Acute bronchospasm: Secondary | ICD-10-CM | POA: Diagnosis not present

## 2018-10-03 DIAGNOSIS — F17210 Nicotine dependence, cigarettes, uncomplicated: Secondary | ICD-10-CM | POA: Insufficient documentation

## 2018-10-03 DIAGNOSIS — R0600 Dyspnea, unspecified: Secondary | ICD-10-CM | POA: Diagnosis not present

## 2018-10-03 DIAGNOSIS — R0609 Other forms of dyspnea: Secondary | ICD-10-CM | POA: Insufficient documentation

## 2018-10-03 LAB — COMPREHENSIVE METABOLIC PANEL
ALT: 24 U/L (ref 0–44)
AST: 17 U/L (ref 15–41)
Albumin: 4.1 g/dL (ref 3.5–5.0)
Alkaline Phosphatase: 93 U/L (ref 38–126)
Anion gap: 11 (ref 5–15)
BUN: 17 mg/dL (ref 6–20)
CO2: 23 mmol/L (ref 22–32)
Calcium: 8.7 mg/dL — ABNORMAL LOW (ref 8.9–10.3)
Chloride: 105 mmol/L (ref 98–111)
Creatinine, Ser: 0.87 mg/dL (ref 0.61–1.24)
GFR calc Af Amer: >60 mL/min (ref >60)
GFR calc non Af Amer: >60 mL/min (ref >60)
Glucose, Bld: 101 mg/dL — ABNORMAL HIGH (ref 70–99)
Potassium: 3.4 mmol/L — ABNORMAL LOW (ref 3.5–5.1)
Sodium: 139 mmol/L (ref 135–145)
Total Bilirubin: 0.8 mg/dL (ref 0.3–1.2)
Total Protein: 6.6 g/dL (ref 6.5–8.1)

## 2018-10-03 LAB — CBC WITH DIFFERENTIAL/PLATELET
Abs Immature Granulocytes: 0.07 10*3/uL (ref 0.00–0.07)
Basophils Absolute: 0.1 10*3/uL (ref 0.0–0.1)
Basophils Relative: 1 %
Eosinophils Absolute: 0.4 10*3/uL (ref 0.0–0.5)
Eosinophils Relative: 4 %
HCT: 44.2 % (ref 39.0–52.0)
Hemoglobin: 14.4 g/dL (ref 13.0–17.0)
Immature Granulocytes: 1 %
Lymphocytes Relative: 23 %
Lymphs Abs: 2.6 10*3/uL (ref 0.7–4.0)
MCH: 31.2 pg (ref 26.0–34.0)
MCHC: 32.6 g/dL (ref 30.0–36.0)
MCV: 95.9 fL (ref 80.0–100.0)
Monocytes Absolute: 1 10*3/uL (ref 0.1–1.0)
Monocytes Relative: 9 %
Neutro Abs: 7.1 10*3/uL (ref 1.7–7.7)
Neutrophils Relative %: 62 %
Platelets: UNDETERMINED 10*3/uL (ref 150–400)
RBC: 4.61 MIL/uL (ref 4.22–5.81)
RDW: 14.5 % (ref 11.5–15.5)
WBC: 11.2 10*3/uL — ABNORMAL HIGH (ref 4.0–10.5)
nRBC: 0 % (ref 0.0–0.2)

## 2018-10-03 LAB — I-STAT TROPONIN, ED: Troponin i, poc: 0 ng/mL (ref 0.00–0.08)

## 2018-10-03 LAB — D-DIMER, QUANTITATIVE: D-Dimer, Quant: <0.27 ug/mL-FEU (ref 0.00–0.50)

## 2018-10-03 LAB — TROPONIN I: Troponin I: <0.03 ng/mL (ref <0.03)

## 2018-10-03 MED ORDER — HYDROXYZINE HCL 25 MG PO TABS: 25.0000 mg | ORAL_TABLET | Freq: Three times a day (TID) | ORAL | 0 refills | Status: DC | PRN

## 2018-10-03 NOTE — ED Triage Notes (Signed)
Patient states he tested positive for flu A 2 weeks ago and is still complaining of shortness of breath. States last fever was on Friday and he was placed on quarantine Wednesday "for 7 days."

## 2018-10-03 NOTE — Discharge Instructions (Addendum)
Person Under Monitoring Name: Francisco [name]  Location: [address] [place] [address]    Individuals who are confirmed to have, or under suspicion for, COVID-19 should follow the prevention steps below until a healthcare provider or local or state health department says they can return to normal activities.  Stay home except to get medical care You should restrict activities outside your home, except for getting medical care. Do not go to work, school, or public areas, and do not use public transportation or taxis.  Call ahead before visiting your doctor Before your medical appointment, call the healthcare provider and tell them that you have, or are being evaluated for, COVID-19 infection. This will help the healthcare providers office take steps to keep other people from getting infected. Ask your healthcare provider to call the local or state health department.  Monitor your symptoms Seek prompt medical attention if your illness is worsening. Before going to your medical appointment, call the healthcare provider and tell them that you have, or are being evaluated for, COVID-19 infection. Ask your healthcare provider to call the local or state health department.  Wear a facemask You should wear a facemask that covers your nose and mouth when you are in the same room with other people and when you visit a healthcare provider. People who live with or visit you should also wear a facemask while they are in the same room with you.  Separate yourself from other people in your home As much as possible, you should stay in a different room from other people in your home. Also, you should use a separate bathroom, if available.  Avoid sharing household items You should not share dishes, drinking glasses, cups, eating utensils, towels, bedding, or other items with other people in your home. After using these items, you should wash them thoroughly with soap and water.  Cover your  coughs and sneezes Cover your mouth and nose with a tissue when you cough or sneeze, or you can cough or sneeze into your sleeve. Throw used tissues in a lined trash can, and immediately wash your hands with soap and water for at least 20 seconds or use an alcohol-based hand rub.  Wash your [employer] your hands often and thoroughly with soap and water for at least 20 seconds. You can use an alcohol-based hand sanitizer if soap and water are not available and if your hands are not visibly dirty. Avoid touching your eyes, nose, and mouth with unwashed hands.   Prevention Steps for Caregivers and Household Members of Individuals Confirmed to have, or Being Evaluated for, COVID-19 Infection Being Cared for in the Home  If you live with, or provide care at home for, a person confirmed to have, or being evaluated for, COVID-19 infection please follow these guidelines to prevent infection:  Follow healthcare providers instructions Make sure that you understand and can help the patient follow any healthcare provider instructions for all care.  Provide for the patients basic needs You should help the patient with basic needs in the home and provide support for getting groceries, prescriptions, and other personal needs.  Monitor the patients symptoms If they are getting sicker, call his or her medical provider and tell them that the patient has, or is being evaluated for, COVID-19 infection. This will help the healthcare providers office take steps to keep other people from getting infected. Ask the healthcare provider to call the local or state health department.  Limit the number of  people who have contact with the patient If possible, have only one caregiver for the patient. Other household members should stay in another home or place of residence. If this is not possible, they should stay in another room, or be separated from the patient as much as possible. Use a separate bathroom, if  available. Restrict visitors who do not have an essential need to be in the home.  Keep older adults, very young children, and other sick people away from the patient Keep older adults, very young children, and those who have compromised immune systems or chronic health conditions away from the patient. This includes people with chronic heart, lung, or kidney conditions, diabetes, and cancer.  Ensure good ventilation Make sure that shared spaces in the home have good air flow, such as from an air conditioner or an opened window, weather permitting.  Wash your hands often Wash your hands often and thoroughly with soap and water for at least 20 seconds. You can use an alcohol based hand sanitizer if soap and water are not available and if your hands are not visibly dirty. Avoid touching your eyes, nose, and mouth with unwashed hands. Use disposable paper towels to dry your hands. If not available, use dedicated cloth towels and replace them when they become wet.  Wear a facemask and gloves Wear a disposable facemask at all times in the room and gloves when you touch or have contact with the patients blood, body fluids, and/or secretions or excretions, such as sweat, saliva, sputum, nasal mucus, vomit, urine, or feces.  Ensure the mask fits over your nose and mouth tightly, and do not touch it during use. Throw out disposable facemasks and gloves after using them. Do not reuse. Wash your hands immediately after removing your facemask and gloves. If your personal clothing becomes contaminated, carefully remove clothing and launder. Wash your hands after handling contaminated clothing. Place all used disposable facemasks, gloves, and other waste in a lined container before disposing them with other household waste. Remove gloves and wash your hands immediately after handling these items.  Do not share dishes, glasses, or other household items with the patient Avoid sharing household items. You  should not share dishes, drinking glasses, cups, eating utensils, towels, bedding, or other items with a patient who is confirmed to have, or being evaluated for, COVID-19 infection. After the person uses these items, you should wash them thoroughly with soap and water.  Wash laundry thoroughly Immediately remove and wash clothes or bedding that have blood, body fluids, and/or secretions or excretions, such as sweat, saliva, sputum, nasal mucus, vomit, urine, or feces, on them. Wear gloves when handling laundry from the patient. Read and follow directions on labels of laundry or clothing items and detergent. In general, wash and dry with the warmest temperatures recommended on the label.  Clean all areas the individual has used often Clean all touchable surfaces, such as counters, tabletops, doorknobs, bathroom fixtures, toilets, phones, keyboards, tablets, and bedside tables, every day. Also, clean any surfaces that may have blood, body fluids, and/or secretions or excretions on them. Wear gloves when cleaning surfaces the patient has come in contact with. Use a diluted bleach solution (e.g., dilute bleach with 1 part bleach and 10 parts water) or a household disinfectant with a label that says EPA-registered for coronaviruses. To make a bleach solution at home, add 1 tablespoon of bleach to 1 quart (4 cups) of water. For a larger supply, add  cup of bleach to 1  gallon (16 cups) of water. Read labels of cleaning products and follow recommendations provided on product labels. Labels contain instructions for safe and effective use of the cleaning product including precautions you should take when applying the product, such as wearing gloves or eye protection and making sure you have good ventilation during use of the product. Remove gloves and wash hands immediately after cleaning.  Monitor yourself for signs and symptoms of illness Caregivers and household members are considered close contacts,  should monitor their health, and will be asked to limit movement outside of the home to the extent possible. Follow the monitoring steps for close contacts listed on the symptom monitoring form.   ? If you have additional questions, contact your local health department or call the epidemiologist on call at [phone] (available 24/7). ? This guidance is subject to change. For the most up-to-date guidance from [facility], please refer to their website: TripMetro.hu    Use your inhaler as needed for wheezing.  You may also take hydroxyzine as needed for anxiety.  Your cardiac work-up is normal in the emergency department however given your risk factors for heart disease, it would be wise to follow-up with a cardiologist.  I provided the contact information of Dr. Wyline Mood.  Continue to follow-up with your primary physician and maintain social distancing while you are still symptomatic.
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
## 2018-10-03 NOTE — ED Notes (Signed)
Per patient's PCP notes of April 8, patient tested positive for flu A approximately 1 month ago and was treated. PCP concerned patient may have Covid 19.

## 2018-10-09 ENCOUNTER — Ambulatory Visit (INDEPENDENT_AMBULATORY_CARE_PROVIDER_SITE_OTHER): Payer: BLUE CROSS/BLUE SHIELD | Admitting: Family Medicine

## 2018-10-09 ENCOUNTER — Telehealth: Payer: Self-pay | Admitting: Family Medicine

## 2018-10-09 ENCOUNTER — Other Ambulatory Visit: Payer: Self-pay

## 2018-10-09 DIAGNOSIS — R6889 Other general symptoms and signs: Secondary | ICD-10-CM

## 2018-10-09 DIAGNOSIS — Z20822 Contact with and (suspected) exposure to covid-19: Secondary | ICD-10-CM
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
## 2018-10-09 NOTE — Telephone Encounter (Signed)
Patient scheduled for appointment today

## 2018-10-10 ENCOUNTER — Ambulatory Visit: Payer: BLUE CROSS/BLUE SHIELD | Admitting: Family Medicine

## 2018-10-13 ENCOUNTER — Other Ambulatory Visit: Payer: Self-pay

## 2018-10-13 ENCOUNTER — Ambulatory Visit (INDEPENDENT_AMBULATORY_CARE_PROVIDER_SITE_OTHER): Payer: BLUE CROSS/BLUE SHIELD | Admitting: Family Medicine

## 2018-10-13 DIAGNOSIS — G8929 Other chronic pain: Secondary | ICD-10-CM

## 2018-10-13 DIAGNOSIS — Z7189 Other specified counseling: Secondary | ICD-10-CM | POA: Diagnosis not present

## 2018-10-13 DIAGNOSIS — M542 Cervicalgia: Secondary | ICD-10-CM | POA: Diagnosis not present

## 2018-10-13 DIAGNOSIS — G933 Postviral fatigue syndrome: Secondary | ICD-10-CM | POA: Diagnosis not present

## 2018-10-13 DIAGNOSIS — R0602 Shortness of breath: Secondary | ICD-10-CM

## 2018-10-13 DIAGNOSIS — G9331 Postviral fatigue syndrome: Secondary | ICD-10-CM

## 2018-10-13 MED ORDER — IBUPROFEN 800 MG PO TABS: 800.0000 mg | ORAL_TABLET | Freq: Three times a day (TID) | ORAL | 1 refills | Status: DC | PRN
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
## 2018-10-16 ENCOUNTER — Encounter: Payer: Self-pay | Admitting: Family Medicine

## 2018-11-02 ENCOUNTER — Ambulatory Visit: Payer: BLUE CROSS/BLUE SHIELD | Admitting: Family Medicine

## 2018-11-02 ENCOUNTER — Other Ambulatory Visit: Payer: Self-pay

## 2018-11-02 ENCOUNTER — Encounter: Payer: Self-pay | Admitting: Family Medicine

## 2018-11-02 VITALS — BP 129/86 | HR 93 | Temp 97.6°F | Ht 69.0 in | Wt 223.0 lb

## 2018-11-02 DIAGNOSIS — N492 Inflammatory disorders of scrotum: Secondary | ICD-10-CM

## 2018-11-02 MED ORDER — DOXYCYCLINE HYCLATE 100 MG PO TABS: 100.0000 mg | ORAL_TABLET | Freq: Two times a day (BID) | ORAL | 0 refills | Status: AC

## 2018-11-02 MED ORDER — CHLORHEXIDINE GLUCONATE 4 % EX LIQD: Freq: Every day | CUTANEOUS | 0 refills | Status: DC | PRN

## 2018-11-02 MED ORDER — TRAMADOL HCL 50 MG PO TABS: 50.0000 mg | ORAL_TABLET | Freq: Three times a day (TID) | ORAL | 0 refills | Status: AC | PRN

## 2018-11-02 NOTE — Patient Instructions (Signed)
How to Take a [employer] Industries[employer] Bath A sitz bath is a warm water bath that may be used to care for your rectum, genital area, or the area between your rectum and genitals (perineum). For a sitz bath, the water only comes up to your hips and covers your buttocks. A sitz bath may done at home in a bathtub or with a portable sitz bath that fits over the toilet. Your health care provider may recommend a sitz bath to help:  Relieve pain and discomfort after delivering a baby.  Relieve pain and itching from hemorrhoids or anal fissures.  Relieve pain after certain surgeries.  Relax muscles that are sore or tight. How to take a sitz bath Take 3-4 sitz baths a day, or as many as told by your health care provider. Bathtub sitz bath To take a sitz bath in a bathtub: 1. Partially fill a bathtub with warm water. The water should be deep enough to cover your hips and buttocks when you are sitting in the tub. 2. If your health care provider told you to put medicine in the water, follow his or her instructions. 3. Sit in the water. 4. Open the tub drain a little, and leave it open during your bath. 5. Turn on the warm water again, enough to replace the water that is draining out. Keep the water running throughout your bath. This helps keep the water at the right level and the right temperature. 6. Soak in the water for 15-20 minutes, or as long as told by your health care provider. 7. When you are done, be careful when you stand up. You may feel dizzy. 8. After the sitz bath, pat yourself dry. Do not rub your skin to dry it.  Over-the-toilet sitz bath To take a sitz bath with an over-the-toilet basin: 1. Follow the manufacturer's instructions. 2. Fill the basin with warm water. 3. If your health care provider told you to put medicine in the water, follow his or her instructions. 4. Sit on the seat. Make sure the water covers your buttocks and perineum. 5. Soak in the water for 15-20 minutes, or as long as told by  your health care provider. 6. After the sitz bath, pat yourself dry. Do not rub your skin to dry it. 7. Clean and dry the basin between uses. 8. Discard the basin if it cracks, or according to the manufacturer's instructions. Contact a health care provider if:  Your symptoms get worse. Do not continue with sitz baths if your symptoms get worse.  You have new symptoms. If this happens, do not continue with sitz baths until you talk with your health care provider. Summary  A sitz bath is a warm water bath in which the water only comes up to your hips and covers your buttocks.  A sitz bath may help relieve itching, relieve pain, and relax muscles that are sore or tight in the lower part of your body, including your genital area.  Take 3-4 sitz baths a day, or as many as told by your health care provider. Soak in the water for 15-20 minutes.  Do not continue with sitz baths if your symptoms get worse. This information is not intended to replace advice given to you by your health care provider. Make sure you discuss any questions you have with your health care provider. Document Released: 02/28/2004 Document Revised: 06/09/2017 Document Reviewed: 06/09/2017 Elsevier Interactive Patient Education  2019 Elsevier Inc. Skin Abscess  A skin abscess is an infected  area of your skin that contains pus and other material. An abscess can happen in any part of your body. Some abscesses break open (rupture) on their own. Most continue to get worse unless they are treated. The infection can spread deeper into the body and into your blood, which can make you feel sick. A skin abscess is caused by germs that enter the skin through a cut or scrape. It can also be caused by blocked oil and sweat glands or infected hair follicles. This condition is usually treated by:  Draining the pus.  Taking antibiotic medicines.  Placing a warm, wet washcloth over the abscess. Follow these instructions at home:  Medicines   Take over-the-counter and prescription medicines only as told by your doctor.  If you were prescribed an antibiotic medicine, take it as told by your doctor. Do not stop taking the antibiotic even if you start to feel better. Abscess care   If you have an abscess that has not drained, place a warm, clean, wet washcloth over the abscess several times a day. Do this as told by your doctor.  Follow instructions from your doctor about how to take care of your abscess. Make sure you: ? Cover the abscess with a bandage (dressing). ? Change your bandage or gauze as told by your doctor. ? Wash your hands with soap and water before you change the bandage or gauze. If you cannot use soap and water, use hand sanitizer.  Check your abscess every day for signs that the infection is getting worse. Check for: ? More redness, swelling, or pain. ? More fluid or blood. ? Warmth. ? More pus or a bad smell. General instructions  To avoid spreading the infection: ? Do not share personal care items, towels, or hot tubs with others. ? Avoid making skin-to-skin contact with other people.  Keep all follow-up visits as told by your doctor. This is important. Contact a doctor if:  You have more redness, swelling, or pain around your abscess.  You have more fluid or blood coming from your abscess.  Your abscess feels warm when you touch it.  You have more pus or a bad smell coming from your abscess.  You have a fever.  Your muscles ache.  You have chills.  You feel sick. Get help right away if:  You have very bad (severe) pain.  You see red streaks on your skin spreading away from the abscess. Summary  A skin abscess is an infected area of your skin that contains pus and other material.  The abscess is caused by germs that enter the skin through a cut or scrape. It can also be caused by blocked oil and sweat glands or infected hair follicles.  Follow your doctor's  instructions on caring for your abscess, taking medicines, preventing infections, and keeping follow-up visits. This information is not intended to replace advice given to you by your health care provider. Make sure you discuss any questions you have with your health care provider. Document Released: 11/24/2007 Document Revised: 07/21/2017 Document Reviewed: 07/21/2017 Elsevier Interactive Patient Education  2019 [employer].
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
## 2018-11-23 ENCOUNTER — Ambulatory Visit: Payer: BLUE CROSS/BLUE SHIELD | Admitting: Orthopaedic Surgery

## 2018-11-23 ENCOUNTER — Ambulatory Visit: Payer: BC Managed Care – PPO | Admitting: Orthopaedic Surgery

## 2018-12-14 ENCOUNTER — Ambulatory Visit: Payer: BC Managed Care – PPO | Admitting: Orthopaedic Surgery

## 2018-12-14 ENCOUNTER — Other Ambulatory Visit: Payer: Self-pay

## 2018-12-27 ENCOUNTER — Encounter (INDEPENDENT_AMBULATORY_CARE_PROVIDER_SITE_OTHER): Payer: Self-pay

## 2019-01-02 ENCOUNTER — Encounter (INDEPENDENT_AMBULATORY_CARE_PROVIDER_SITE_OTHER): Payer: Self-pay

## 2019-01-02 ENCOUNTER — Telehealth (INDEPENDENT_AMBULATORY_CARE_PROVIDER_SITE_OTHER): Payer: Self-pay | Admitting: Internal Medicine

## 2019-01-02 NOTE — Telephone Encounter (Signed)
Patients wife called stated she spoke to Terri this morning by mychart and was told to talk to you about spouses FMLA   -  Ph# is [phone] or work# [phone]

## 2019-01-15 DIAGNOSIS — Z0289 Encounter for other administrative examinations: Secondary | ICD-10-CM

## 2019-01-23 ENCOUNTER — Encounter (INDEPENDENT_AMBULATORY_CARE_PROVIDER_SITE_OTHER): Payer: Self-pay | Admitting: Nurse Practitioner

## 2019-01-23 ENCOUNTER — Ambulatory Visit (INDEPENDENT_AMBULATORY_CARE_PROVIDER_SITE_OTHER): Payer: BC Managed Care – PPO | Admitting: Nurse Practitioner

## 2019-01-23 ENCOUNTER — Encounter (INDEPENDENT_AMBULATORY_CARE_PROVIDER_SITE_OTHER): Payer: Self-pay | Admitting: *Deleted

## 2019-01-23 ENCOUNTER — Other Ambulatory Visit: Payer: Self-pay

## 2019-01-23 VITALS — BP 136/84 | HR 93 | Temp 97.9°F | Ht 69.0 in | Wt 212.9 lb

## 2019-01-23 DIAGNOSIS — R1032 Left lower quadrant pain: Secondary | ICD-10-CM | POA: Diagnosis not present

## 2019-01-23 MED ORDER — AMOXICILLIN-POT CLAVULANATE 875-125 MG PO TABS: 1.0000 | ORAL_TABLET | Freq: Two times a day (BID) | ORAL | 0 refills | Status: AC

## 2019-01-23 NOTE — Progress Notes (Signed)
   Subjective:    Patient ID: Francisco [name], male    DOB: 07-Jun-1971, 48 y.o.   MRN: [id number]  HPI  Mr. Francisco [name] is a 48 y.o. male with a past medical history of diverticulitis who presents today with complaints of LLQ abdominal pain which started 1 week ago. His LLQ pain has progressively worsened over the past 2 days, he vomited clear emesis x 2. His abdominal pain is worse when he drives over bumps in the road. No fever. He is also passing looser stools than normal. No rectal bleeding or melena. No recent antibiotics. He last took Flagyl for presumed diverticulitis in Jan. 2020, he had an old supply of Flagyl so he took it for about 1 week. He is allergic to Cipro, developed a severe headache after taking it. He stated having at least 5 episodes of diverticulitis in the past. He typically changes his diet to a bland diet for a few days if he develops LLQ pain and his pain sometimes abates without seeking medical evaluation. He was last treated with antibiotics for diverticulitis after being seen at an urgent care in [facility] 03/2018. He had recurrent LLQ pain and an abd/pelvic CT done 04/25/2018 which was negative for diverticulitis. He reports having C.diff infection in the past but he cannot remember when. He had 2 colonoscopies done in the past. The most recent colonoscopy was done 02/28/2017 by Dr. Lilia Pro at [facility]. A colonoscopy done 06/28/2014 identified a small polyp to the distal transverse colon, sigmoid and rectum, sigmoid diverticulosis noted. I am not able to locate the biopsy results in Epic. No known family history of colon cancer.  Review of Systems See HPI all other systems reviewed and are negative      Objective:   Physical Exam  BP 136/84   Pulse 93   Temp 97.9 F (36.6 C)   Ht 5\' 9"  (1.753 m)   Wt 212 lb 14.4 oz (96.6 kg)   BMI 31.44 kg/m   General: 48 y.o. male holding onto his left side in NAD Eyes: no scleral icterus, conjunctiva pink Mouth: no pharyngeal  erythema or ulcers Neck: no lymphadenopathy Heart: RRR, no murmur Lungs: clear throughout Abdomen: slightly distended, LLQ tenderness without rebound but mild guarding, + BS x 4 quads Extremities: no edema    Assessment & Plan:   1. LLQ abdominal pain, most likely has sigmoid diverticulitis -he declined going to [facility] ED for a stat CTAP, I discussed his abdominal pain is worse when he drives over bumps in the road, mild guarding on exam concerning for peritoneal inflammation which warrants CT imaging. A CTAP w/wo contrast was scheduled for tomorrow by our office staff, he will go to the lab now to check a CBC, CMP and CRP. He agrees to go to the emergency room if his abdominal pain worsens. -Augmentin 875mg  1 tab po bid x 10 days to start today. He has tolerated Augmentin in the past without adverse response. If he develops worsening diarrhea, will need to change antibiotic and check for C. Diff. Criss Rosales low fiber diet for 24 hours, push fluids -work excuse 8/3-8/5 provided -follow up in the office in 2 weeks

## 2019-01-23 NOTE — Patient Instructions (Signed)
1. Complete the above lab order now  2. Schedule an abdominal/pelvic CAT scan ASAP  3. Start Augmentin 875mg  one tab by mouth twice daily x 10 days  4. Go to the local emergency room if you develop severe abdominal pain   5. Follow up in the office in 2 weeks, call office if your symptoms worsens  6. Bland low fiber diet for 24 hours, push fluids

## 2019-01-24 ENCOUNTER — Encounter (INDEPENDENT_AMBULATORY_CARE_PROVIDER_SITE_OTHER): Payer: Self-pay | Admitting: Nurse Practitioner

## 2019-01-24 ENCOUNTER — Ambulatory Visit (HOSPITAL_COMMUNITY): Payer: BC Managed Care – PPO

## 2019-01-24 LAB — CBC WITH DIFFERENTIAL/PLATELET
Absolute Monocytes: 560 cells/uL (ref 200–950)
Basophils Absolute: 80 cells/uL (ref 0–200)
Basophils Relative: 1 %
Eosinophils Absolute: 144 cells/uL (ref 15–500)
Eosinophils Relative: 1.8 %
HCT: 44.2 % (ref 38.5–50.0)
Hemoglobin: 15 g/dL (ref 13.2–17.1)
Lymphs Abs: 1304 cells/uL (ref 850–3900)
MCH: 31.4 pg (ref 27.0–33.0)
MCHC: 33.9 g/dL (ref 32.0–36.0)
MCV: 92.5 fL (ref 80.0–100.0)
MPV: 10.2 fL (ref 7.5–12.5)
Monocytes Relative: 7 %
Neutro Abs: 5912 cells/uL (ref 1500–7800)
Neutrophils Relative %: 73.9 %
Platelets: 289 10*3/uL (ref 140–400)
RBC: 4.78 10*6/uL (ref 4.20–5.80)
RDW: 13.5 % (ref 11.0–15.0)
Total Lymphocyte: 16.3 %
WBC: 8 10*3/uL (ref 3.8–10.8)

## 2019-01-24 LAB — COMPLETE METABOLIC PANEL WITH GFR
AG Ratio: 2 (calc) (ref 1.0–2.5)
ALT: 17 U/L (ref 9–46)
AST: 14 U/L (ref 10–40)
Albumin: 4.5 g/dL (ref 3.6–5.1)
Alkaline phosphatase (APISO): 94 U/L (ref 36–130)
BUN: 10 mg/dL (ref 7–25)
CO2: 26 mmol/L (ref 20–32)
Calcium: 9.5 mg/dL (ref 8.6–10.3)
Chloride: 105 mmol/L (ref 98–110)
Creat: 0.95 mg/dL (ref 0.60–1.35)
GFR, Est African American: 111 mL/min/{1.73_m2} (ref >=60)
GFR, Est Non African American: 96 mL/min/{1.73_m2} (ref >=60)
Globulin: 2.3 g/dL (calc) (ref 1.9–3.7)
Glucose, Bld: 106 mg/dL (ref 65–139)
Potassium: 4.6 mmol/L (ref 3.5–5.3)
Sodium: 138 mmol/L (ref 135–146)
Total Bilirubin: 0.5 mg/dL (ref 0.2–1.2)
Total Protein: 6.8 g/dL (ref 6.1–8.1)

## 2019-01-24 LAB — C-REACTIVE PROTEIN: CRP: 2.2 mg/L (ref <8.0)

## 2019-01-26 ENCOUNTER — Encounter (HOSPITAL_COMMUNITY): Payer: Self-pay

## 2019-01-26 ENCOUNTER — Ambulatory Visit (HOSPITAL_COMMUNITY)
Admission: RE | Admit: 2019-01-26 | Discharge: 2019-01-26 | Disposition: A | Discharge status: Home / Self Care | Payer: BC Managed Care – PPO | Source: Ambulatory Visit | Attending: Nurse Practitioner | Admitting: Nurse Practitioner

## 2019-01-26 ENCOUNTER — Other Ambulatory Visit: Payer: Self-pay

## 2019-01-26 DIAGNOSIS — K5730 Diverticulosis of large intestine without perforation or abscess without bleeding: Secondary | ICD-10-CM | POA: Diagnosis not present

## 2019-01-26 DIAGNOSIS — R1032 Left lower quadrant pain: Secondary | ICD-10-CM | POA: Insufficient documentation

## 2019-01-26 MED ORDER — IOHEXOL 300 MG/ML  SOLN
100.0000 mL | Freq: Once | INTRAMUSCULAR | Status: AC | PRN
  Administered 2019-01-26: 100 mL via INTRAVENOUS

## 2019-01-30 ENCOUNTER — Ambulatory Visit (INDEPENDENT_AMBULATORY_CARE_PROVIDER_SITE_OTHER): Payer: BC Managed Care – PPO | Admitting: Nurse Practitioner

## 2019-01-30 ENCOUNTER — Telehealth (INDEPENDENT_AMBULATORY_CARE_PROVIDER_SITE_OTHER): Payer: Self-pay | Admitting: Nurse Practitioner

## 2019-01-30 ENCOUNTER — Encounter (INDEPENDENT_AMBULATORY_CARE_PROVIDER_SITE_OTHER): Payer: Self-pay | Admitting: Nurse Practitioner

## 2019-01-30 ENCOUNTER — Other Ambulatory Visit: Payer: Self-pay

## 2019-01-30 ENCOUNTER — Encounter (INDEPENDENT_AMBULATORY_CARE_PROVIDER_SITE_OTHER): Payer: Self-pay

## 2019-01-30 VITALS — BP 118/84 | HR 89 | Temp 98.2°F | Ht 73.0 in | Wt 210.7 lb

## 2019-01-30 DIAGNOSIS — A09 Infectious gastroenteritis and colitis, unspecified: Secondary | ICD-10-CM

## 2019-01-30 DIAGNOSIS — R197 Diarrhea, unspecified: Secondary | ICD-10-CM | POA: Insufficient documentation

## 2019-01-30 DIAGNOSIS — R1032 Left lower quadrant pain: Secondary | ICD-10-CM | POA: Diagnosis not present

## 2019-01-30 MED ORDER — METRONIDAZOLE 500 MG PO TABS: 500.0000 mg | ORAL_TABLET | Freq: Two times a day (BID) | ORAL | 0 refills | Status: DC

## 2019-01-30 NOTE — Patient Instructions (Signed)
1. Stop Augmentin  2. Start Flagyl (Metronidazole) 500mg  one tab by mouth twice daily x 10 days  3.  Go to lab now for CBC, CRP. Complete the c.diff stool test asap, the lab will provide you with a stool container.  4.. Bland diet, rice, soup, chicken, apple sauce, banana, push fluids  5. Go to the local emergency room if your abdominal pain worsens   6. Follow up in our office in 2 weeks

## 2019-01-30 NOTE — Progress Notes (Signed)
   Subjective:    Patient ID: Francisco [name], male    DOB: [birth year], 48 y.o.   MRN: [id number]  HPI   He was seen in office on 01/23/2019 with LLQ abdominal pain. He was prescribed Augmentin 875mg  one tab po bid as an abdominal/pelvic CT could not be scheduled until 01/26/2019. Labs done 8/4 WBC 8.0. CRP 2.2. The abd/pelvic CT with contrast confirmed mild sigmoid diverticulitis without abscess or perforation. He was constipated and started taking Miralax. His LLQ abdominal pain lessened over the weekend. 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 He started to have diarrhea yesterday, passed 4 episodes of diarrhea today. No bloody diarrhea. He previously tolerated Augmentin without diarrhea, however, he reported having C. Diff in the past after taking antibiotics. No fever. He is eating a bland diet. No alcohol.      Objective:   Physical Exam  Blood pressure 118/84, pulse 89, temperature 98.2 F (36.8 C), height 6\' 1"  (1.854 m), weight 210 lb 11.2 oz (95.6 kg). General: 48 y.o. male in NAD Heart: RRR, no murmur Lungs: clear Abdomen: LLQ tenderness without rebound or guarding, slight + shake tenderness, + BS x 4 quads    Assessment & Plan:   1. Sigmoid diverticulitis confirmed by CTAP without evidence of abscess or perforation 8/7 with worsening LLQ pain and diarrhea on Augmentin. Patient declines to go to the ED for further evaluation. -CBC and CRP now -check c. Diff PCR -Stop Augmentin -Start Flagyl 500mg  po bid, no alcohol while taking discussed with patient -work excuse provided 8/11-8/05/2019 -Advised patient to go to the ER if his abdominal pain worsens -push fluids, bland diet -Follow up in office in 2 weeks -

## 2019-01-30 NOTE — Telephone Encounter (Signed)
Patient left message stating he is in more pain after taking the prescribed medication - please call him at [phone]

## 2019-01-30 NOTE — Telephone Encounter (Signed)
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 I advised pt he will need repeat exam, labs and possible repeat CTAP if his exam warrants. Patient prefers not to go to the ER. He will come in office today at 2pm.

## 2019-01-31 LAB — CBC WITH DIFFERENTIAL/PLATELET
Absolute Monocytes: 630 cells/uL (ref 200–950)
Basophils Absolute: 103 cells/uL (ref 0–200)
Basophils Relative: 1.1 %
Eosinophils Absolute: 263 cells/uL (ref 15–500)
Eosinophils Relative: 2.8 %
HCT: 45.8 % (ref 38.5–50.0)
Hemoglobin: 15.3 g/dL (ref 13.2–17.1)
Lymphs Abs: 1683 cells/uL (ref 850–3900)
MCH: 31.8 pg (ref 27.0–33.0)
MCHC: 33.4 g/dL (ref 32.0–36.0)
MCV: 95.2 fL (ref 80.0–100.0)
MPV: 9.7 fL (ref 7.5–12.5)
Monocytes Relative: 6.7 %
Neutro Abs: 6721 cells/uL (ref 1500–7800)
Neutrophils Relative %: 71.5 %
Platelets: 311 10*3/uL (ref 140–400)
RBC: 4.81 10*6/uL (ref 4.20–5.80)
RDW: 13.9 % (ref 11.0–15.0)
Total Lymphocyte: 17.9 %
WBC: 9.4 10*3/uL (ref 3.8–10.8)

## 2019-01-31 LAB — C-REACTIVE PROTEIN: CRP: 1.9 mg/L (ref <8.0)

## 2019-02-01 ENCOUNTER — Encounter (INDEPENDENT_AMBULATORY_CARE_PROVIDER_SITE_OTHER): Payer: Self-pay | Admitting: Nurse Practitioner

## 2019-02-13 ENCOUNTER — Ambulatory Visit (INDEPENDENT_AMBULATORY_CARE_PROVIDER_SITE_OTHER): Payer: BC Managed Care – PPO | Admitting: Nurse Practitioner

## 2019-04-16 DIAGNOSIS — J0100 Acute maxillary sinusitis, unspecified: Secondary | ICD-10-CM | POA: Diagnosis not present

## 2019-04-16 DIAGNOSIS — Z6831 Body mass index (BMI) 31.0-31.9, adult: Secondary | ICD-10-CM | POA: Diagnosis not present

## 2019-04-16 DIAGNOSIS — R519 Headache, unspecified: Secondary | ICD-10-CM | POA: Diagnosis not present

## 2019-04-16 DIAGNOSIS — R52 Pain, unspecified: Secondary | ICD-10-CM | POA: Diagnosis not present

## 2019-04-18 ENCOUNTER — Other Ambulatory Visit: Payer: Self-pay

## 2019-04-18 ENCOUNTER — Telehealth (INDEPENDENT_AMBULATORY_CARE_PROVIDER_SITE_OTHER): Payer: BC Managed Care – PPO | Admitting: Family Medicine

## 2019-04-18 DIAGNOSIS — J069 Acute upper respiratory infection, unspecified: Secondary | ICD-10-CM

## 2019-04-18 MED ORDER — BENZONATATE 200 MG PO CAPS: 200.0000 mg | ORAL_CAPSULE | Freq: Two times a day (BID) | ORAL | 0 refills | Status: DC | PRN
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
## 2019-04-20 DIAGNOSIS — R531 Weakness: Secondary | ICD-10-CM | POA: Diagnosis not present

## 2019-04-20 DIAGNOSIS — R5081 Fever presenting with conditions classified elsewhere: Secondary | ICD-10-CM | POA: Diagnosis not present

## 2019-04-20 DIAGNOSIS — Z6832 Body mass index (BMI) 32.0-32.9, adult: Secondary | ICD-10-CM | POA: Diagnosis not present

## 2019-04-20 DIAGNOSIS — R509 Fever, unspecified: Secondary | ICD-10-CM | POA: Diagnosis not present

## 2019-07-05 ENCOUNTER — Other Ambulatory Visit: Payer: Self-pay | Admitting: Nurse Practitioner

## 2019-07-05 ENCOUNTER — Telehealth: Payer: Self-pay | Admitting: Nurse Practitioner

## 2019-07-05 DIAGNOSIS — K5732 Diverticulitis of large intestine without perforation or abscess without bleeding: Secondary | ICD-10-CM

## 2019-07-05 MED ORDER — AMOXICILLIN-POT CLAVULANATE 875-125 MG PO TABS: 1.0000 | ORAL_TABLET | Freq: Two times a day (BID) | ORAL | 0 refills | Status: DC

## 2019-07-05 NOTE — Telephone Encounter (Signed)
Please review previous message Called and spoke with patient-patient reports he is nauseated and having the "sharp pains in my abdomen just like all of the other times I had a flare up"; constipation vs. diarrhea at times; denies fever/resp distress/chest pains/vomiting/rectal bleeding/rectal pain;   Patient reports he is having to work through the pain;   Patient requests a phone call back and gave permission to leave a VM if he does not answer;   Please advise

## 2019-07-05 NOTE — Telephone Encounter (Signed)
Liborio Nixon, I forwarded a msg to you earlier today which came to me in [facility]. Pt c/o diverticulitis sx. I saw him 01/2019 and CTAP confirmed diverticulitis. I sent RX for Augmentin 875mg  1 po bid x 7 days and I informed his wife he needs to schedule an ov with you early next week for follow up and if his abd pain worsens to go to the local hospital ER. I informed the pt's wife that and Dr. Liborio Nixon would be managing his care as I am currently not in the [place] office.  Tammy, pls call the pt tomorrow for a symptom update and pls schedule him for a follow up appt with [facility] or Dr. Liborio Nixon early next week.  THX

## 2019-07-06 MED ORDER — AMOXICILLIN-POT CLAVULANATE 875-125 MG PO TABS: 1.0000 | ORAL_TABLET | Freq: Two times a day (BID) | ORAL | 0 refills | Status: DC

## 2019-07-06 NOTE — Telephone Encounter (Signed)
Addendum Jill Side sent in ABX already on other message thread. Can call pharmacy and let them know we sent duplicate orders. Should only fill one.   Thanks.

## 2019-07-06 NOTE — Telephone Encounter (Signed)
Patient called and a message left asking that he call office with a progress report.

## 2019-07-06 NOTE — Telephone Encounter (Signed)
Thank you very much Colleen. Will have Tammy call patient (also addressed on message thread from his advice request).

## 2019-07-06 NOTE — Telephone Encounter (Signed)
Tammy - please call patient and let him know I will send in ABX since we are not in the office today to see him but very important to go to ER/urgent care over weekend if not improving w/ ABX. Will send in augment 875 BID x 10 days. He should be on very soft/bland diet w/ plenty of clear liquids.   I would like to see him in office within next 2 weeks to ensure getting better and discuss long term plan for diverticulitis. Thanks.

## 2019-07-12 ENCOUNTER — Encounter: Payer: Self-pay | Admitting: Family Medicine

## 2019-07-12 ENCOUNTER — Encounter (INDEPENDENT_AMBULATORY_CARE_PROVIDER_SITE_OTHER): Payer: Self-pay

## 2019-07-12 ENCOUNTER — Other Ambulatory Visit: Payer: Self-pay

## 2019-07-12 ENCOUNTER — Ambulatory Visit (INDEPENDENT_AMBULATORY_CARE_PROVIDER_SITE_OTHER): Payer: Managed Care, Other (non HMO) | Admitting: Family Medicine

## 2019-07-12 DIAGNOSIS — R1032 Left lower quadrant pain: Secondary | ICD-10-CM | POA: Diagnosis not present

## 2019-07-12 DIAGNOSIS — R112 Nausea with vomiting, unspecified: Secondary | ICD-10-CM

## 2019-07-12 DIAGNOSIS — Z8719 Personal history of other diseases of the digestive system: Secondary | ICD-10-CM
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
## 2019-07-13 ENCOUNTER — Telehealth: Payer: Managed Care, Other (non HMO) | Admitting: Family Medicine

## 2019-07-22 ENCOUNTER — Encounter: Payer: Self-pay | Admitting: Family Medicine

## 2019-07-25 ENCOUNTER — Telehealth: Payer: Managed Care, Other (non HMO) | Admitting: Family Medicine

## 2019-09-27 ENCOUNTER — Ambulatory Visit (INDEPENDENT_AMBULATORY_CARE_PROVIDER_SITE_OTHER): Payer: Managed Care, Other (non HMO) | Admitting: Orthopaedic Surgery

## 2019-09-27 ENCOUNTER — Other Ambulatory Visit: Payer: Self-pay

## 2019-09-27 ENCOUNTER — Encounter: Payer: Self-pay | Admitting: Orthopaedic Surgery

## 2019-09-27 ENCOUNTER — Ambulatory Visit (INDEPENDENT_AMBULATORY_CARE_PROVIDER_SITE_OTHER): Payer: Managed Care, Other (non HMO)

## 2019-09-27 VITALS — BP 130/79 | HR 90 | Ht 69.0 in | Wt 225.0 lb

## 2019-09-27 DIAGNOSIS — M542 Cervicalgia: Secondary | ICD-10-CM

## 2019-09-27 DIAGNOSIS — M5030 Other cervical disc degeneration, unspecified cervical region: Secondary | ICD-10-CM | POA: Insufficient documentation
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
## 2019-09-28 ENCOUNTER — Other Ambulatory Visit: Payer: Self-pay | Admitting: Family Medicine

## 2019-09-28 DIAGNOSIS — M542 Cervicalgia: Secondary | ICD-10-CM

## 2019-10-23 ENCOUNTER — Ambulatory Visit (INDEPENDENT_AMBULATORY_CARE_PROVIDER_SITE_OTHER): Payer: Managed Care, Other (non HMO) | Admitting: Internal Medicine

## 2019-10-23 ENCOUNTER — Encounter (INDEPENDENT_AMBULATORY_CARE_PROVIDER_SITE_OTHER): Payer: Self-pay | Admitting: Internal Medicine

## 2019-10-23 ENCOUNTER — Other Ambulatory Visit: Payer: Self-pay

## 2019-10-23 DIAGNOSIS — J209 Acute bronchitis, unspecified: Secondary | ICD-10-CM | POA: Insufficient documentation

## 2019-10-23 DIAGNOSIS — K588 Other irritable bowel syndrome: Secondary | ICD-10-CM

## 2019-10-23 DIAGNOSIS — K589 Irritable bowel syndrome without diarrhea: Secondary | ICD-10-CM | POA: Insufficient documentation

## 2019-10-23 MED ORDER — AZITHROMYCIN 250 MG PO TABS: ORAL_TABLET | ORAL | 0 refills | Status: DC

## 2019-10-23 MED ORDER — DICYCLOMINE HCL 10 MG PO CAPS: 10.0000 mg | ORAL_CAPSULE | Freq: Three times a day (TID) | ORAL | 2 refills | Status: DC

## 2019-10-23 MED ORDER — ALBUTEROL SULFATE HFA 108 (90 BASE) MCG/ACT IN AERS: 2.0000 | INHALATION_SPRAY | Freq: Four times a day (QID) | RESPIRATORY_TRACT | 1 refills | Status: DC | PRN

## 2019-10-23 NOTE — Patient Instructions (Signed)
Keep ibuprofen dose to minimum as discussed. Please call office with progress report in 2 weeks. Please keep stool diary as to frequency and consistency of stools until next office visit.

## 2019-10-23 NOTE — Progress Notes (Signed)
Presenting complaint;  History of diverticulitis. Patient complains of lower abdominal pain and urgency.  Database and subjective:  Patient is a 49 year old Caucasian male who was diagnosed with sigmoid diverticulitis back on 01/26/2019 confirmed on CT.  He was initially treated with Augmentin and developed diarrhea and was switched to metronidazole.  Stool C. difficile was recommended but not completed.  He was last seen in the office on 01/30/2019. He has 2 complaints today.  He complains of pain across lower abdomen as well in the rectum before he has a bowel movement.  This pain is resolved completely once his bowels move.  He has extreme urgency.  He is having anywhere from 2-4 bowel movements per day.  Stools are usually soft.  He also has intermittent nocturnal diarrhea.  He denies melena or rectal bleeding.  He has chronic low back pain.  He tells me he does not take ibuprofen more than 2 or 3 times a week.  His appetite is normal.  He has gained 11 pounds since his last visit of August 2020. He also complains of chest congestion and cough.  He has been taking Mucinex but it has not helped.  He is not had fever or chills. He does not drink alcohol.  He is trying to quit cigarette smoking.  He is down to half a pack per day.  Current Medications: Outpatient Encounter Medications as of 10/23/2019  Medication Sig  . ibuprofen (ADVIL) 800 MG tablet TAKE 1 TABLET BY MOUTH THREE TIMES DAILY AS NEEDED FOR PAIN  . amoxicillin-clavulanate (AUGMENTIN) 875-125 MG tablet Take 1 tablet by mouth 2 (two) times daily. (Patient not taking: Reported on 09/27/2019)  . benzonatate (TESSALON) 200 MG capsule Take 1 capsule (200 mg total) by mouth 2 (two) times daily as needed for cough. (Patient not taking: Reported on 09/27/2019)  . chlorhexidine (HIBICLENS) 4 % external liquid Apply topically daily as needed. (Patient not taking: Reported on 09/27/2019)  . SUMAtriptan (IMITREX) 50 MG tablet Take 50 mg by mouth every 2  (two) hours as needed for migraine. May repeat in 2 hours if headache persists or recurs.   No facility-administered encounter medications on file as of 10/23/2019.     Objective: Blood pressure 118/88, pulse (!) 106, temperature (!) 97.3 F (36.3 C), temperature source Temporal, height '6\' 1"'  (1.854 m), weight 221 lb 11.2 oz (100.6 kg). Patient is alert and in no acute distress but he is coughing repeatedly in appears congested. Patient is wearing facial mask. Conjunctiva is pink. Sclera is nonicteric Oropharyngeal mucosa is normal. No neck masses or thyromegaly noted. Cardiac exam with regular rhythm normal S1 and S2. No murmur or gallop noted. Auscultation lungs reveal scattered bilateral expiratory rhonchi. Abdomen is symmetrical.  He has small scar in right lower quadrant as well as umbilical scar.  On palpation abdomen is soft and nontender with organomegaly or masses. No LE edema or clubbing noted.  Labs/studies Results:  CBC Latest Ref Rng & Units 01/30/2019 01/23/2019 10/03/2018  WBC 3.8 - 10.8 Thousand/uL 9.4 8.0 11.2(H)  Hemoglobin 13.2 - 17.1 g/dL 15.3 15.0 14.4  Hematocrit 38.5 - 50.0 % 45.8 44.2 44.2  Platelets 140 - 400 Thousand/uL 311 289 PLATELET CLUMPS NOTED ON SMEAR, UNABLE TO ESTIMATE    CMP Latest Ref Rng & Units 01/23/2019 10/03/2018 03/23/2018  Glucose 65 - 139 mg/dL 106 101(H) 105(H)  BUN 7 - 25 mg/dL '10 17 10  ' Creatinine 0.60 - 1.35 mg/dL 0.95 0.87 0.99  Sodium 135 - 146  mmol/L 138 139 142  Potassium 3.5 - 5.3 mmol/L 4.6 3.4(L) 3.9  Chloride 98 - 110 mmol/L 105 105 106  CO2 20 - 32 mmol/L '26 23 20  ' Calcium 8.6 - 10.3 mg/dL 9.5 8.7(L) 9.5  Total Protein 6.1 - 8.1 g/dL 6.8 6.6 6.7  Total Bilirubin 0.2 - 1.2 mg/dL 0.5 0.8 0.2  Alkaline Phos 38 - 126 U/L - 93 103  AST 10 - 40 U/L '14 17 19  ' ALT 9 - 46 U/L '17 24 25    ' Hepatic Function Latest Ref Rng & Units 01/23/2019 10/03/2018 03/23/2018  Total Protein 6.1 - 8.1 g/dL 6.8 6.6 6.7  Albumin 3.5 - 5.0 g/dL - 4.1 4.6   AST 10 - 40 U/L '14 17 19  ' ALT 9 - 46 U/L '17 24 25  ' Alk Phosphatase 38 - 126 U/L - 93 103  Total Bilirubin 0.2 - 1.2 mg/dL 0.5 0.8 0.2  Bilirubin, Direct 0.0 - 0.3 mg/dL - - -       Assessment:  #1.  Irritable bowel syndrome.  He has frequent bowel movements and urgency.  His symptoms are typical.  He would benefit from low-dos antispasmodic.  #2.  History of sigmoid diverticulitis last treated over 4 months ago.  Advised to keep NSAID use to minimum.  #3.  Acute bronchitis with bronchospasm.  Patient is trying to quit cigarette smoking.  He has not responded to Mucinex.  #4.  History of colonic polyps.  He had 3 polyps removed in January 2016.  Path results not available.   Plan:  Request copy of polyp path results from [facility]. Dicyclomine 10 mg by mouth 30 minutes before each meal.  Patient may try taking it twice daily first. Azithromycin 500 mg by mouth the day and thereafter to 50 mg daily for 4 more days. Albuterol inhaler 2 puffs every 6 hours as needed. Counseled for the need to quit cigarette smoking.  He is working on it. Keep stool diary as to frequency and consistency of stools until his next visit.  He can do it on days when he can. Patient will call with progress report in 2 weeks return for office visit in 3 months.

## 2019-10-25 ENCOUNTER — Other Ambulatory Visit: Payer: Self-pay

## 2019-10-25 ENCOUNTER — Encounter (INDEPENDENT_AMBULATORY_CARE_PROVIDER_SITE_OTHER): Payer: Self-pay

## 2019-10-25 ENCOUNTER — Ambulatory Visit: Payer: Managed Care, Other (non HMO) | Admitting: Orthopaedic Surgery

## 2020-01-23 ENCOUNTER — Encounter (INDEPENDENT_AMBULATORY_CARE_PROVIDER_SITE_OTHER): Payer: Self-pay | Admitting: Gastroenterology

## 2020-01-23 ENCOUNTER — Ambulatory Visit (INDEPENDENT_AMBULATORY_CARE_PROVIDER_SITE_OTHER): Payer: Managed Care, Other (non HMO) | Admitting: Gastroenterology

## 2020-01-23 ENCOUNTER — Other Ambulatory Visit: Payer: Self-pay

## 2020-01-23 VITALS — BP 117/80 | HR 90 | Temp 98.1°F | Ht 69.0 in | Wt 225.0 lb

## 2020-01-23 DIAGNOSIS — R1030 Lower abdominal pain, unspecified: Secondary | ICD-10-CM

## 2020-01-23 DIAGNOSIS — K5730 Diverticulosis of large intestine without perforation or abscess without bleeding: Secondary | ICD-10-CM | POA: Diagnosis not present

## 2020-01-23 DIAGNOSIS — R110 Nausea: Secondary | ICD-10-CM

## 2020-01-23 NOTE — Patient Instructions (Signed)
-  Try fiber supplement such as benefiber or citrucel.  -Important to keep food journal and let me know if symptoms continue
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
## 2020-07-03 ENCOUNTER — Encounter: Payer: Self-pay | Admitting: Family Medicine

## 2020-07-03 ENCOUNTER — Ambulatory Visit (INDEPENDENT_AMBULATORY_CARE_PROVIDER_SITE_OTHER): Payer: 59 | Admitting: Family Medicine

## 2020-07-03 DIAGNOSIS — J0110 Acute frontal sinusitis, unspecified: Secondary | ICD-10-CM | POA: Diagnosis not present

## 2020-07-03 MED ORDER — AMOXICILLIN-POT CLAVULANATE 875-125 MG PO TABS: 1.0000 | ORAL_TABLET | Freq: Two times a day (BID) | ORAL | 0 refills | Status: DC

## 2020-07-03 NOTE — Progress Notes (Signed)
Virtual Visit via telephone Note  I connected with Francisco [name] on 07/03/20 at 1733 by telephone and verified that I am speaking with the correct person using two identifiers. Francisco [name] is currently located at home and patient are currently with her during visit. The provider, Elige Radon Dettinger, MD is located in their office at time of visit.  Call ended at 1738  I discussed the limitations, risks, security and privacy concerns of performing an evaluation and management service by telephone and the availability of in person appointments. I also discussed with the patient that there may be a patient responsible charge related to this service. The patient expressed understanding and agreed to proceed.   History and Present Illness: Patient is calling in for sinus pressure and sore throat and earache.  This started after he got over covid.  He tested positive on 06/24/20. He started having this over the past 2 days.  He has pressure into his ear. He was vaccinated against. He denies any shortness of breath.  He is using flonase and it helps some.  No diagnosis found.  Outpatient Encounter Medications as of 07/03/2020  Medication Sig  . albuterol (VENTOLIN HFA) 108 (90 Base) MCG/ACT inhaler Inhale 2 puffs into the lungs every 6 (six) hours as needed for wheezing or shortness of breath.  . dicyclomine (BENTYL) 10 MG capsule Take 1 capsule (10 mg total) by mouth 3 (three) times daily before meals. (Patient not taking: Reported on 01/23/2020)  . SUMAtriptan (IMITREX) 50 MG tablet Take 50 mg by mouth every 2 (two) hours as needed for migraine. May repeat in 2 hours if headache persists or recurs.   No facility-administered encounter medications on file as of 07/03/2020.    Review of Systems  Constitutional: Negative for chills and fever.  HENT: Positive for congestion, postnasal drip, rhinorrhea, sinus pressure, sneezing and sore throat. Negative for ear discharge, ear pain and voice change.    Eyes: Negative for pain, discharge, redness and visual disturbance.  Respiratory: Positive for cough. Negative for shortness of breath and wheezing.   Cardiovascular: Negative for chest pain and leg swelling.  Musculoskeletal: Negative for gait problem.  Skin: Negative for rash.  All other systems reviewed and are negative.   Observations/Objective: Patient sounds comfortable and in no acute distress.  Assessment and Plan: Problem List Items Addressed This Visit   None   Visit Diagnoses    Acute non-recurrent frontal sinusitis    -  Primary   Relevant Medications   amoxicillin-clavulanate (AUGMENTIN) 875-125 MG tablet      Continue flonase and Augmentin to see if he is getting better. Follow up plan: Return if symptoms worsen or fail to improve.     I discussed the assessment and treatment plan with the patient. The patient was provided an opportunity to ask questions and all were answered. The patient agreed with the plan and demonstrated an understanding of the instructions.   The patient was advised to call back or seek an in-person evaluation if the symptoms worsen or if the condition fails to improve as anticipated.  The above assessment and management plan was discussed with the patient. The patient verbalized understanding of and has agreed to the management plan. Patient is aware to call the clinic if symptoms persist or worsen. Patient is aware when to return to the clinic for a follow-up visit. Patient educated on when it is appropriate to go to the emergency department.    I provided 5  minutes of non-face-to-face time during this encounter.    Worthy Rancher, MD

## 2021-03-27 IMAGING — DX CHEST - 2 VIEW
2 series · 2 of 2 positions shown · non-contrast
Comparison: None.

CLINICAL DATA: Shortness of breath and tachycardia for 2 weeks.

EXAM:
CHEST - 2 VIEW

[chest pa]
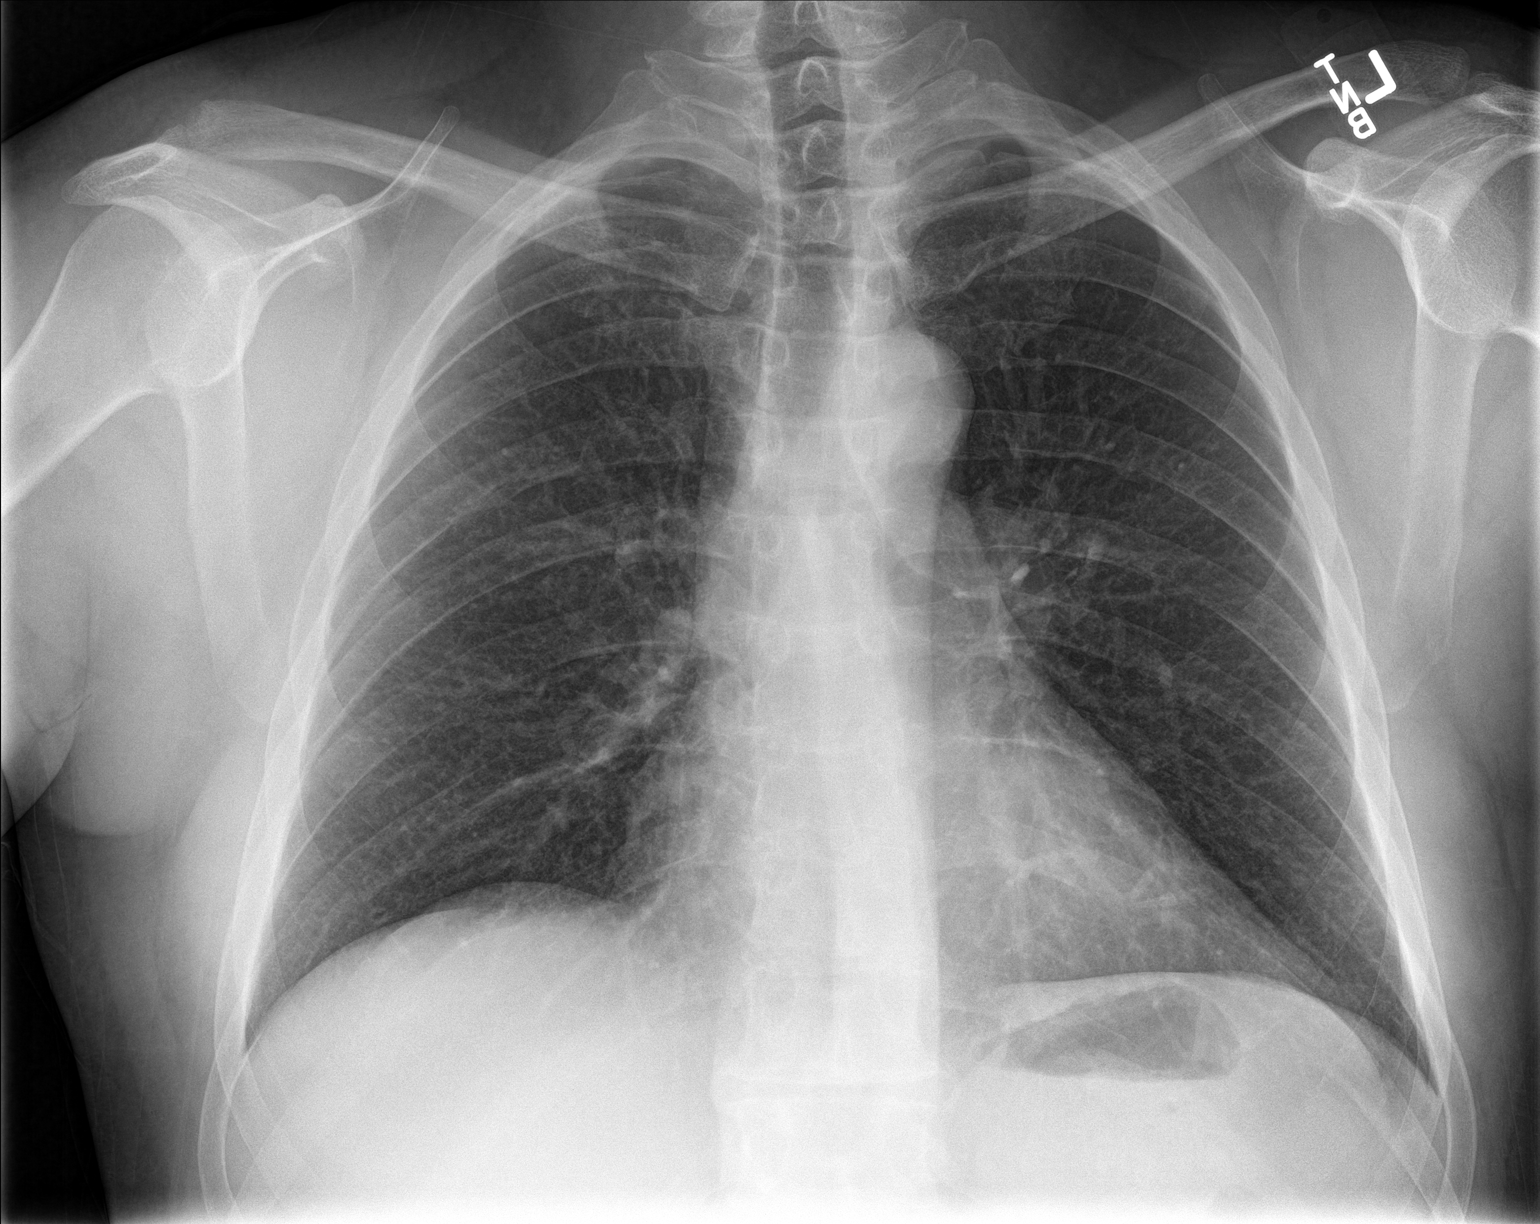

[chest lat]
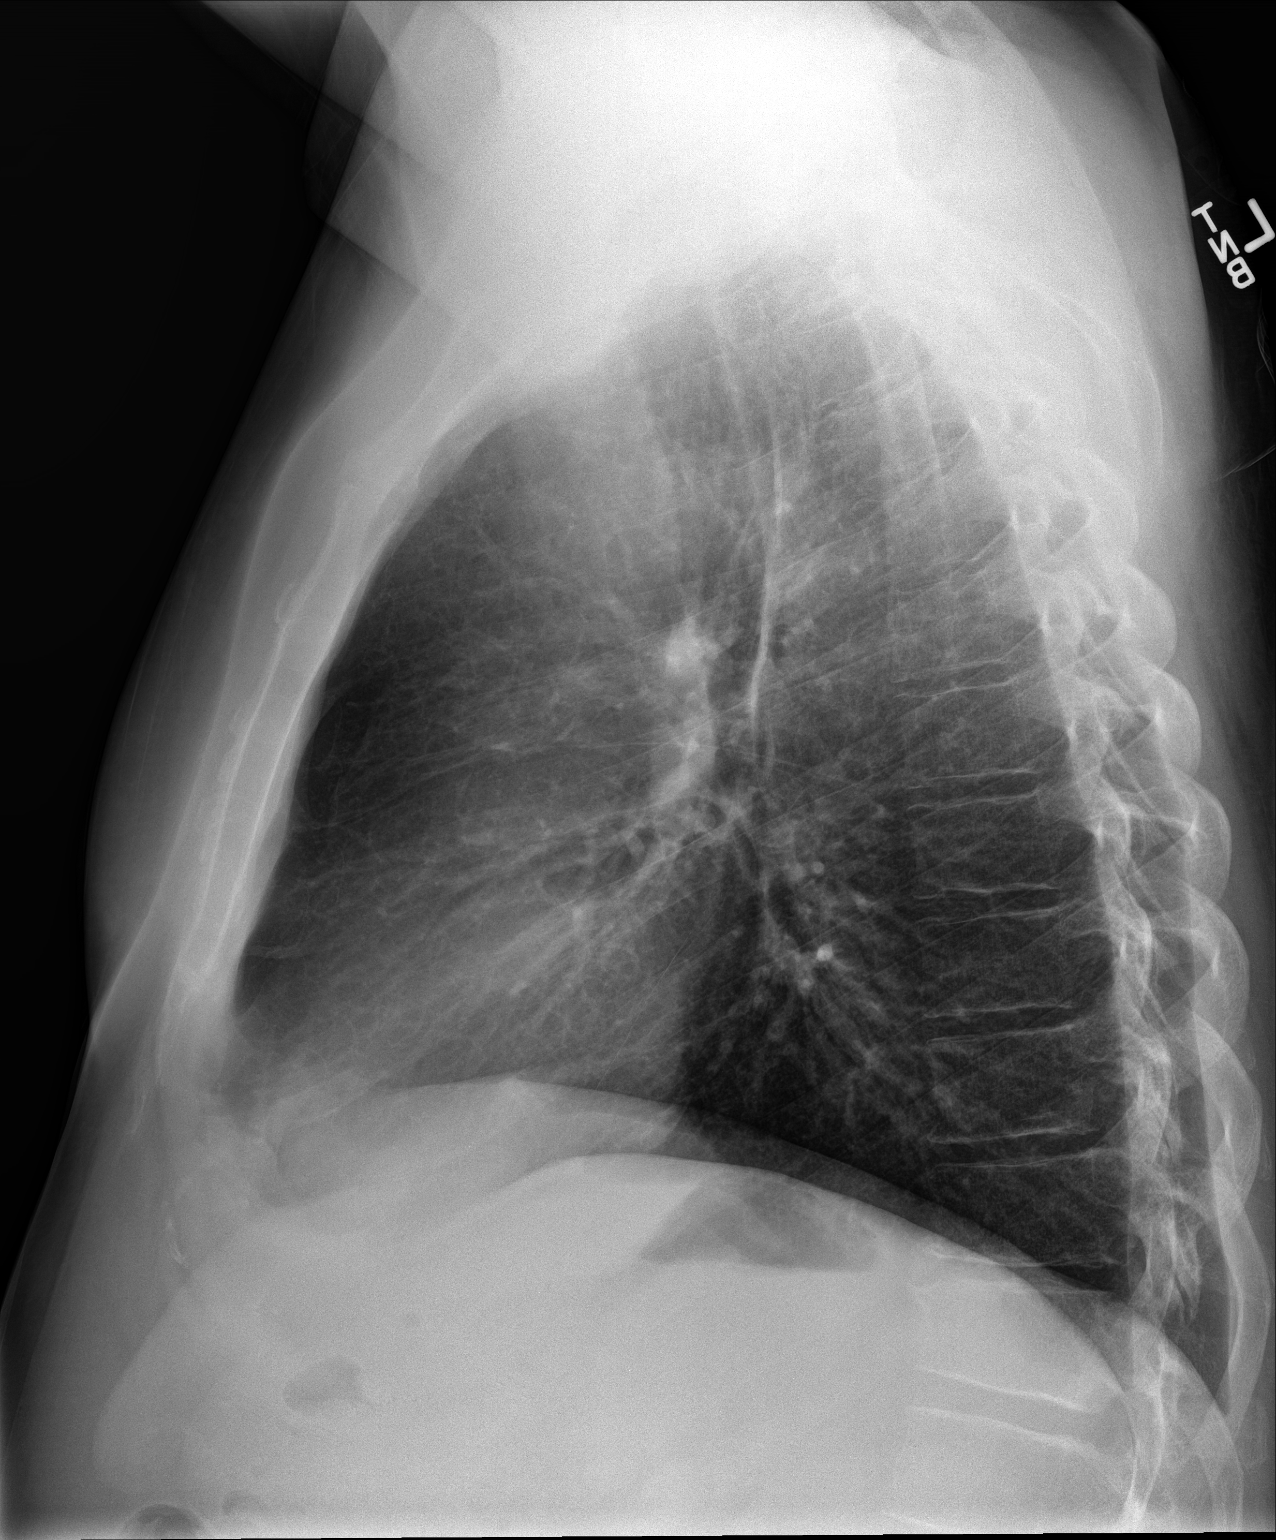

[2 of 2 positions shown; findings below may reference images not displayed]

FINDINGS: The cardiomediastinal silhouette is unremarkable.

There is no evidence of focal airspace disease, pulmonary edema,
suspicious pulmonary nodule/mass, pleural effusion, or pneumothorax.

No acute bony abnormalities are identified.
IMPRESSION: No active cardiopulmonary disease.

## 2021-05-26 ENCOUNTER — Encounter: Payer: Self-pay | Admitting: Nurse Practitioner

## 2021-05-26 ENCOUNTER — Ambulatory Visit: Payer: 59 | Admitting: Nurse Practitioner

## 2021-05-26 VITALS — BP 129/88 | HR 81 | Temp 98.3°F | Ht 69.0 in | Wt 230.2 lb

## 2021-05-26 DIAGNOSIS — G4730 Sleep apnea, unspecified: Secondary | ICD-10-CM | POA: Diagnosis not present

## 2021-05-26 DIAGNOSIS — F410 Panic disorder [episodic paroxysmal anxiety] without agoraphobia: Secondary | ICD-10-CM

## 2021-05-26 MED ORDER — HYDROXYZINE HCL 10 MG PO TABS: 10.0000 mg | ORAL_TABLET | Freq: Three times a day (TID) | ORAL | 0 refills | Status: DC | PRN
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
## 2021-05-26 NOTE — Assessment & Plan Note (Signed)
Completed referral to sleep study.

## 2021-05-26 NOTE — Assessment & Plan Note (Signed)
Started patient on hydroxyzine 10 mg tablet by mouth as needed.  Follow-up with worsening unresolved symptoms.  Completed GAD-7.  Education provided to patient printed handouts given.

## 2021-05-26 NOTE — Patient Instructions (Signed)
Panic Attack A panic attack is when you suddenly feel very afraid, uncomfortable, or nervous (anxious). A panic attack can happen when you are scared, or it may happen for no reason. A panic attack can feel like a heart attack or stroke. See your doctor when you have a panic attack to make sure you are not having a heart attack or stroke. What are the causes? Experiencing things that threaten your life, such as a war. Feeling worried or nervous for a long time (anxiety disorder). Being sad (depressed). Panic disorder. Certain medical conditions. Other causes may include: Certain medicines. Taking certain supplements. Illegal drugs. What increases the risk? Having another mental health condition. Using alcohol or drugs. Being under a lot of stress. Having events in your life that cause worry and sadness. What are the signs or symptoms? A panic attack: Starts suddenly. May last 5-10 minutes. Symptoms include one or more of these: A pounding heart. A feeling that your heart is beating in an unusual way or faster than normal (palpitations). Sweating or shaking. Feeling short of breath. Chest pain. Feeling like you may vomit (nauseous). Feeling dizzy or like you might faint. Other symptoms may include: Chills or hot flashes. Numbness or tingling in your lips, hands, or feet. Feeling confused. Fear of losing control. Fear of dying. How is this treated? A panic attack is a symptom of another condition. Treatment depends on the cause of the panic attack. If the cause is a medical problem, your doctor will treat that problem or refer you to a specialist. If the cause is emotional, you may be given medicines or referred to a counselor. If the cause is a medicine, your doctor may tell you to stop the medicine, change your dose, or take a different medicine. If the cause is an illegal drug, treatment may involve letting the drug wear off and taking medicine to help the drug leave your  body or to stop its effects. Attacks caused by heavy drug use may continue even if you stop using the drug. Most panic attacks go away after the cause is treated. Follow these instructions at home: Alcohol use Do not drink alcohol if: Your doctor tells you not to drink. You are pregnant, may be pregnant, or are planning to become pregnant. If you drink alcohol: Limit how much you have to: 0-1 drink a day for women. 0-2 drinks a day for men. Know how much alcohol is in your drink. In the [place] one drink equals one 12 oz bottle of beer (355 mL), one 5 oz glass of wine (148 mL), or one 1 oz glass of hard liquor (44 mL). General instructions  Take over-the-counter and prescription medicines only as told by your doctor. If you feel worried or nervous, try not to have caffeine. Take good care of your health. To do this: Eat healthy. Make sure to eat fresh fruits and vegetables, whole grains, lean meats, and low-fat dairy. Get enough sleep. Try to sleep for 7-8 hours each night. Exercise. Try to be active for 30 minutes 5 or more days a week. Do not smoke or use any products that contain nicotine or tobacco. If you need help quitting, ask your doctor. Keep all follow-up visits. Where to find more information Substance Abuse and Mental Health Services Administration (SAMHSA): samhsa.gov National Institute of Mental Health (NIMH): www.nimh.nih.gov Contact a doctor if: Your symptoms do not get better. Your symptoms get worse. You are not able to take your medicines as told. Get help   right away if: You have thoughts of hurting yourself or others. Get help right away if you feel like you may hurt yourself or others, or have thoughts about taking your own life. Go to your nearest emergency room or: Call 911. Call the National Suicide Prevention Lifeline at 325-307-2498 or 988. This is open 24 hours a day. Text the Crisis Text Line at 712-228-5725. Summary A panic attack is when you suddenly feel  very afraid, uncomfortable, or nervous (anxious). See your doctor when you have a panic attack to make sure that you do not have another serious problem. If you feel like you may hurt yourself or others, get help right away. Call 911. This information is not intended to replace advice given to you by your health care provider. Make sure you discuss any questions you have with your health care provider. Document Revised: 01/15/2021 Document Reviewed: 01/15/2021 Elsevier Patient Education  2022 [employer] Sleep Apnea Sleep apnea affects breathing during sleep. It causes breathing to stop for 10 seconds or more, or to become shallow. People with sleep apnea usually snore loudly. It can also increase the risk of: Heart attack. Stroke. Being very overweight (obese). Diabetes. Heart failure. Irregular heartbeat. High blood pressure. The goal of treatment is to help you breathe normally again. What are the causes? The most common cause of this condition is a collapsed or blocked airway. There are three kinds of sleep apnea: Obstructive sleep apnea. This is caused by a blocked or collapsed airway. Central sleep apnea. This happens when the brain does not send the right signals to the muscles that control breathing. Mixed sleep apnea. This is a combination of obstructive and central sleep apnea. What increases the risk? Being overweight. Smoking. Having a small airway. Being older. Being male. Drinking alcohol. Taking medicines to calm yourself (sedatives or tranquilizers). Having family members with the condition. Having a tongue or tonsils that are larger than normal. What are the signs or symptoms? Trouble staying asleep. Loud snoring. Headaches in the morning. Waking up gasping. Dry mouth or sore throat in the morning. Being sleepy or tired during the day. If you are sleepy or tired during the day, you may also: Not be able to focus your mind (concentrate). Forget  things. Get angry a lot and have mood swings. Feel sad (depressed). Have changes in your personality. Have less interest in sex, if you are male. Be unable to have an erection, if you are male. How is this treated?  Sleeping on your side. Using a medicine to get rid of mucus in your nose (decongestant). Avoiding the use of alcohol, medicines to help you relax, or certain pain medicines (narcotics). Losing weight, if needed. Changing your diet. Quitting smoking. Using a machine to open your airway while you sleep, such as: An oral appliance. This is a mouthpiece that shifts your lower jaw forward. A CPAP device. This device blows air through a mask when you breathe out (exhale). An EPAP device. This has valves that you put in each nostril. A BIPAP device. This device blows air through a mask when you breathe in (inhale) and breathe out. Having surgery if other treatments do not work. Follow these instructions at home: Lifestyle Make changes that your doctor recommends. Eat a healthy diet. Lose weight if needed. Avoid alcohol, medicines to help you relax, and some pain medicines. Do not smoke or use any products that contain nicotine or tobacco. If you need help quitting, ask your doctor. General instructions Take  over-the-counter and prescription medicines only as told by your doctor. If you were given a machine to use while you sleep, use it only as told by your doctor. If you are having surgery, make sure to tell your doctor you have sleep apnea. You may need to bring your device with you. Keep all follow-up visits. Contact a doctor if: The machine that you were given to use during sleep bothers you or does not seem to be working. You do not get better. You get worse. Get help right away if: Your chest hurts. You have trouble breathing in enough air. You have an uncomfortable feeling in your back, arms, or stomach. You have trouble talking. One side of your body feels  weak. A part of your face is hanging down. These symptoms may be an emergency. Get help right away. Call your local emergency services (911 in the [place] Do not wait to see if the symptoms will go away. Do not drive yourself to the hospital. Summary This condition affects breathing during sleep. The most common cause is a collapsed or blocked airway. The goal of treatment is to help you breathe normally while you sleep. This information is not intended to replace advice given to you by your health care provider. Make sure you discuss any questions you have with your health care provider. Document Revised: 01/14/2021 Document Reviewed: 05/16/2020 Elsevier Patient Education  2022 [employer].

## 2021-06-02 ENCOUNTER — Encounter: Payer: Self-pay | Admitting: Nurse Practitioner

## 2021-06-04 ENCOUNTER — Other Ambulatory Visit: Payer: Self-pay

## 2021-06-04 ENCOUNTER — Ambulatory Visit: Payer: BC Managed Care – PPO | Admitting: Orthopaedic Surgery

## 2021-07-15 DIAGNOSIS — B349 Viral infection, unspecified: Secondary | ICD-10-CM | POA: Diagnosis not present

## 2021-07-15 DIAGNOSIS — M791 Myalgia, unspecified site: Secondary | ICD-10-CM | POA: Diagnosis not present

## 2021-07-15 DIAGNOSIS — Z20822 Contact with and (suspected) exposure to covid-19: Secondary | ICD-10-CM | POA: Diagnosis not present

## 2021-07-21 IMAGING — CT CT ABDOMEN AND PELVIS WITH CONTRAST
2 of 5 series · 16 of 46 positions shown, 18 images · IV contrast (Isovue)
Comparison: None.

CLINICAL DATA: Abdominal pain, left lower quadrant, diverticulitis
suspected

EXAM:
CT ABDOMEN AND PELVIS WITH CONTRAST
TECHNIQUE: Multidetector CT imaging of the abdomen and pelvis was performed
using the standard protocol following bolus administration of
intravenous contrast.
CONTRAST:  100mL OMNIPAQUE IOHEXOL 300 MG/ML SOLN, additional oral
enteric contrast

[Series 2: axial st · axial · 0.79mm/px · z∈[+912,+1352]mm · 13 of 100 slices shown, 15 images]
[im 6/100  soft-tissue]
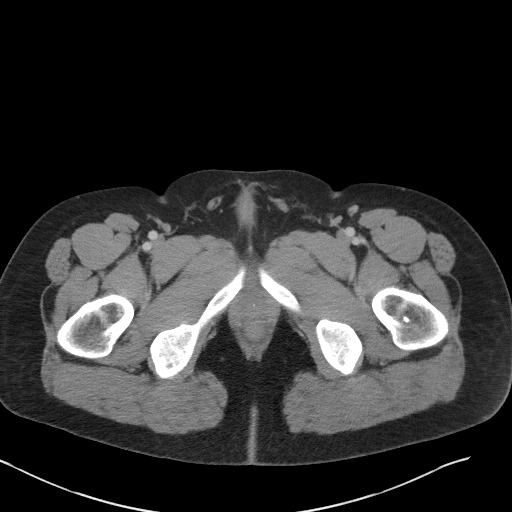
[im 6/100  bone]
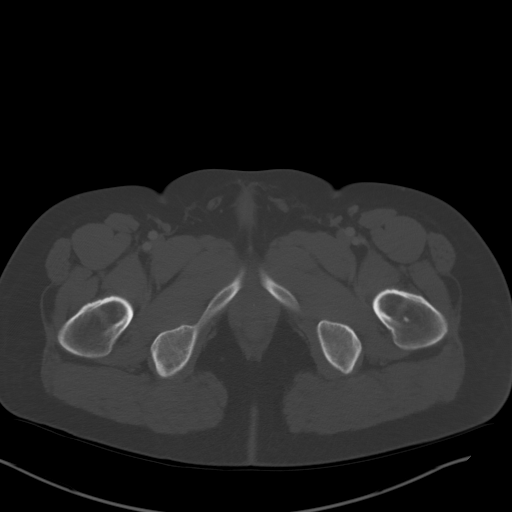
[im 12/100  soft-tissue]
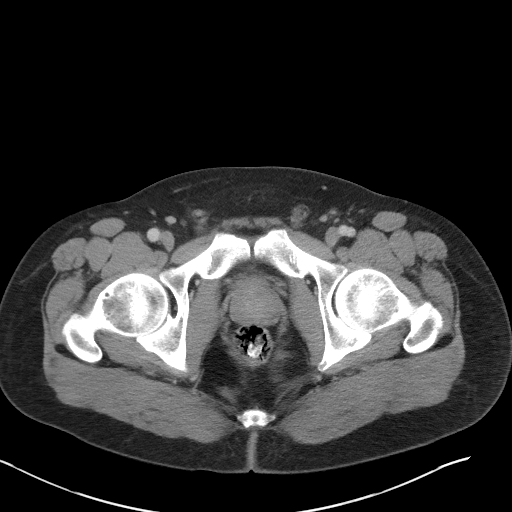
[im 24/100  soft-tissue]
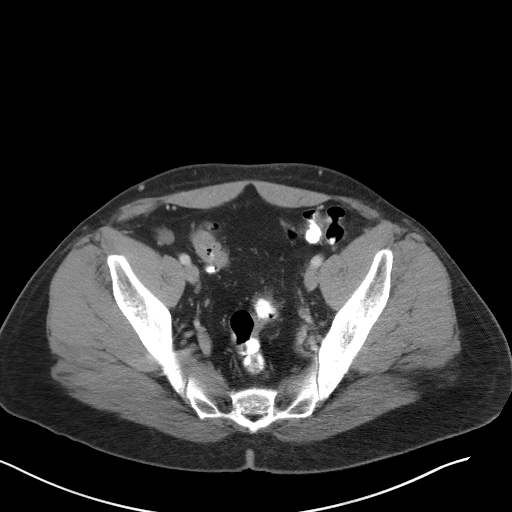
[im 30/100  soft-tissue]
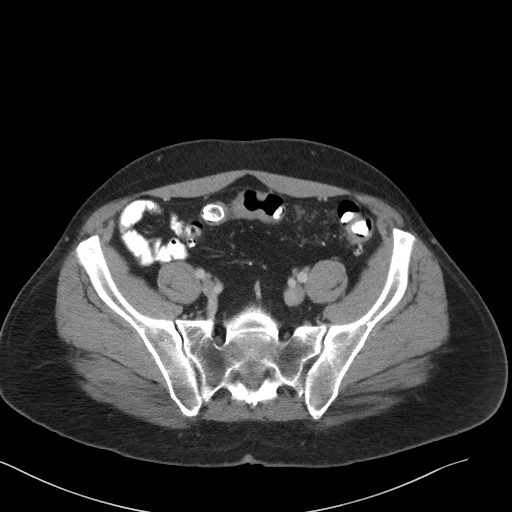
[im 35/100  soft-tissue]
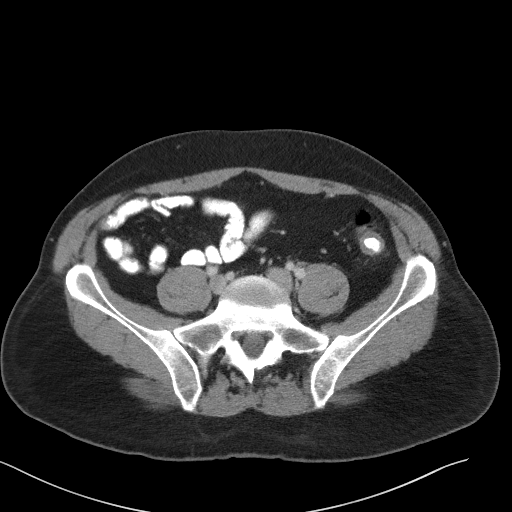
[im 41/100  soft-tissue]
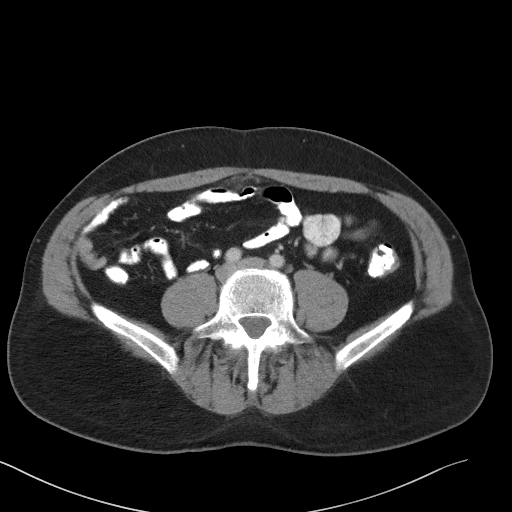
[im 53/100  soft-tissue]
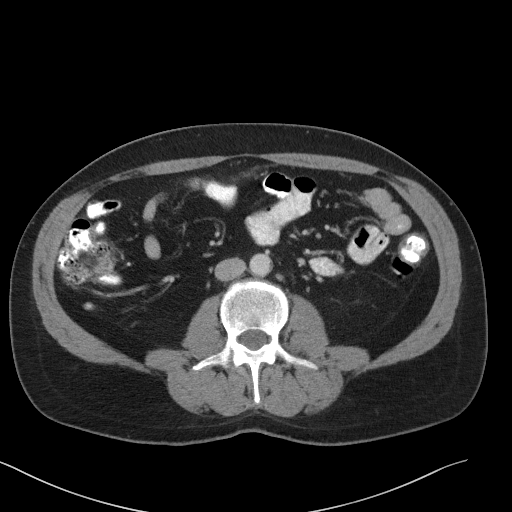
[im 59/100  soft-tissue]
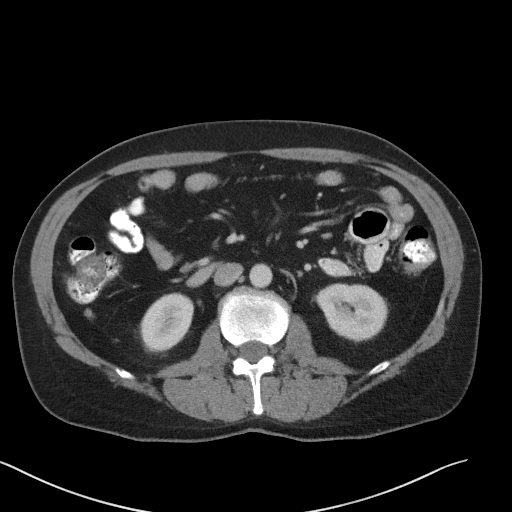
[im 65/100  soft-tissue]
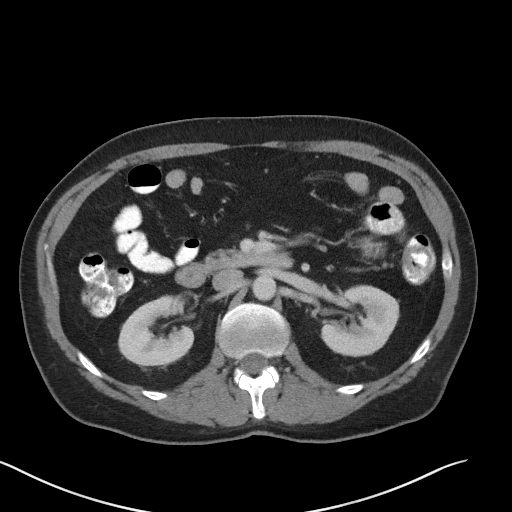
[im 65/100  bone]
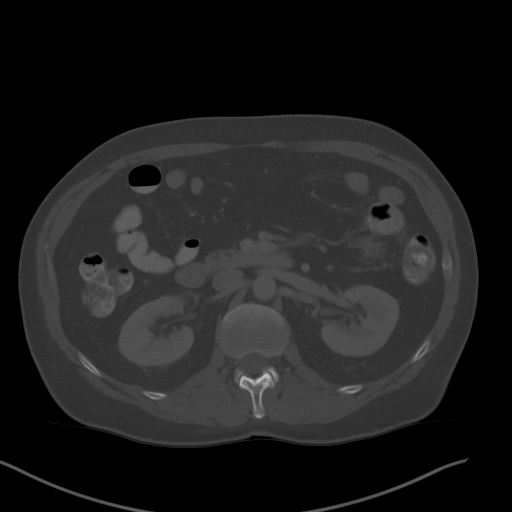
[im 70/100  soft-tissue]
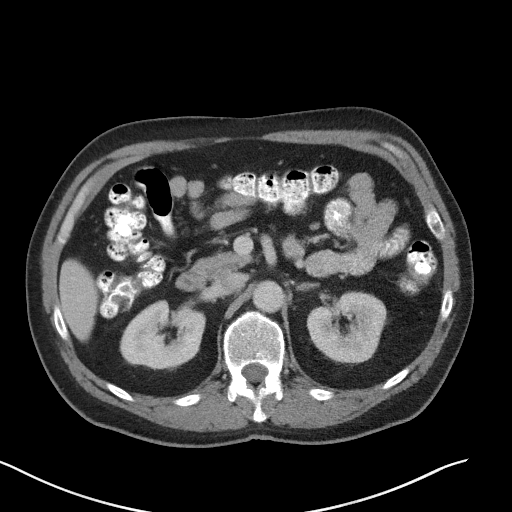
[im 76/100  soft-tissue]
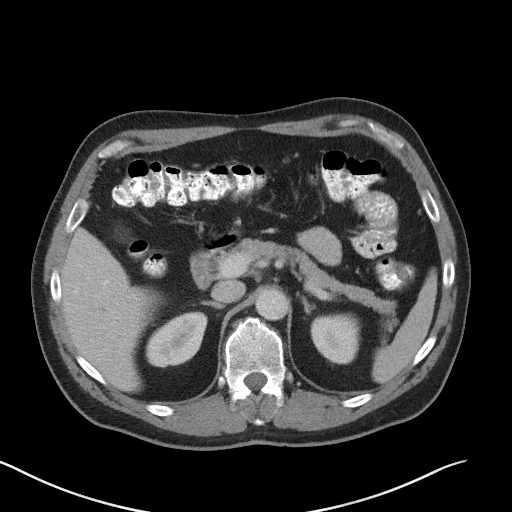
[im 88/100  soft-tissue]
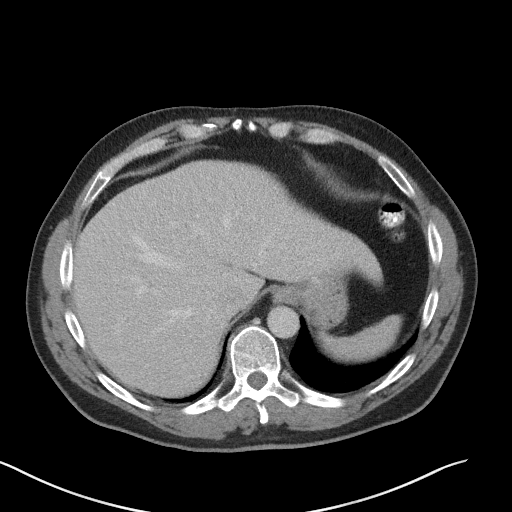
[im 94/100  soft-tissue]
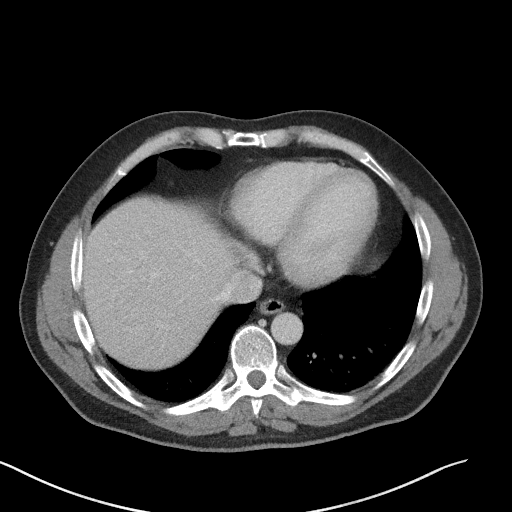

[Series 5: coronal st · coronal · 0.73mm/px · 3 of 92 slices shown]
[im 31/92  soft-tissue]
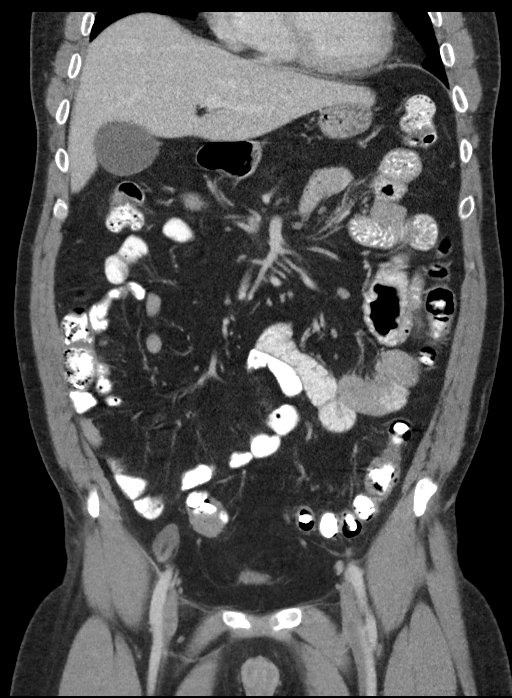
[im 41/92  soft-tissue]
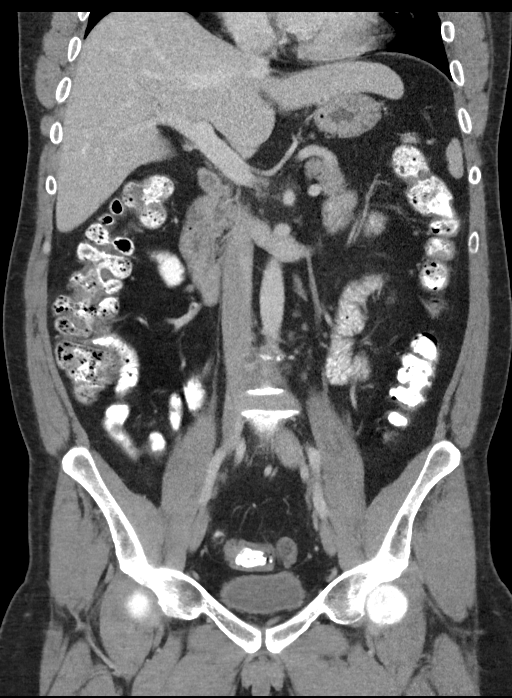
[im 51/92  soft-tissue]
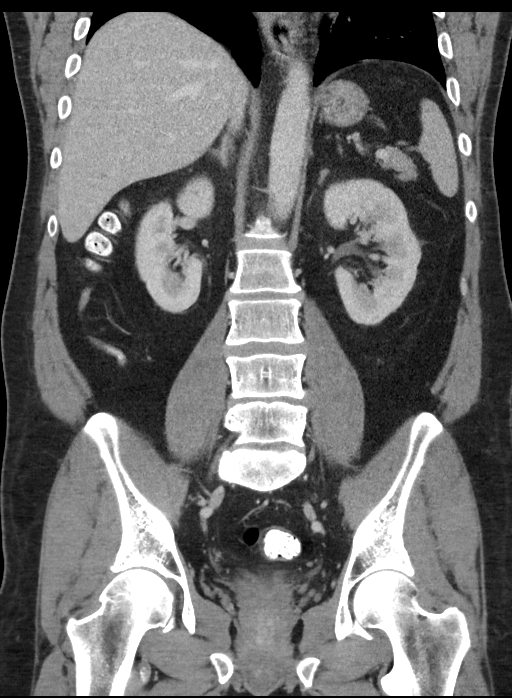

[16 of 46 positions shown; findings below may reference images not displayed]

FINDINGS: Lower chest: No acute abnormality.

Hepatobiliary: No solid liver abnormality is seen. No gallstones,
gallbladder wall thickening, or biliary dilatation.

Pancreas: Unremarkable. No pancreatic ductal dilatation or
surrounding inflammatory changes.

Spleen: Normal in size without significant abnormality.

Adrenals/Urinary Tract: Adrenal glands are unremarkable. Kidneys are
normal, without renal calculi, solid lesion, or hydronephrosis.
Bladder is unremarkable.

Stomach/Bowel: Stomach is within normal limits. Appendix appears
normal. Sigmoid diverticulosis with mild thickening and fat
stranding about the sigmoid (series 5, image 24, series 2, image
73).

Vascular/Lymphatic: No significant vascular findings are present. No
enlarged abdominal or pelvic lymph nodes.

Reproductive: No mass or other significant abnormality.

Other: No abdominal wall hernia or abnormality. Evidence of prior
inguinal and umbilical hernia repair. No abdominopelvic ascites.

Musculoskeletal: No acute or significant osseous findings.
IMPRESSION: Sigmoid diverticulosis with mild thickening and fat stranding about
the sigmoid (series 5, image 24, series 2, image 73). Findings are
consistent with mild diverticulitis. No evidence of abscess or
perforation.

## 2021-07-27 ENCOUNTER — Telehealth: Payer: Self-pay | Admitting: Family Medicine

## 2021-07-27 ENCOUNTER — Other Ambulatory Visit: Payer: Self-pay | Admitting: Nurse Practitioner

## 2021-07-27 ENCOUNTER — Encounter: Payer: Self-pay | Admitting: Nurse Practitioner

## 2021-07-27 DIAGNOSIS — F410 Panic disorder [episodic paroxysmal anxiety] without agoraphobia: Secondary | ICD-10-CM

## 2021-07-27 MED ORDER — HYDROXYZINE HCL 10 MG PO TABS: 10.0000 mg | ORAL_TABLET | Freq: Three times a day (TID) | ORAL | 1 refills | Status: DC | PRN

## 2021-10-05 ENCOUNTER — Institutional Professional Consult (permissible substitution): Payer: BC Managed Care – PPO | Admitting: Neurology

## 2021-10-06 ENCOUNTER — Encounter: Payer: Self-pay | Admitting: Neurology

## 2021-11-05 DIAGNOSIS — M546 Pain in thoracic spine: Secondary | ICD-10-CM | POA: Diagnosis not present

## 2021-12-09 ENCOUNTER — Ambulatory Visit: Payer: BC Managed Care – PPO | Admitting: Family Medicine

## 2021-12-09 ENCOUNTER — Encounter: Payer: Self-pay | Admitting: Family Medicine

## 2021-12-09 VITALS — BP 120/72 | HR 70 | Temp 97.9°F | Ht 69.0 in | Wt 228.8 lb

## 2021-12-09 DIAGNOSIS — M67432 Ganglion, left wrist: Secondary | ICD-10-CM
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
## 2022-04-06 DIAGNOSIS — M791 Myalgia, unspecified site: Secondary | ICD-10-CM | POA: Diagnosis not present

## 2022-04-06 DIAGNOSIS — Z20822 Contact with and (suspected) exposure to covid-19: Secondary | ICD-10-CM | POA: Diagnosis not present

## 2022-05-20 ENCOUNTER — Ambulatory Visit: Payer: BC Managed Care – PPO | Admitting: Family Medicine

## 2022-05-20 DIAGNOSIS — F1120 Opioid dependence, uncomplicated: Secondary | ICD-10-CM | POA: Diagnosis not present

## 2022-05-26 DIAGNOSIS — F1120 Opioid dependence, uncomplicated: Secondary | ICD-10-CM | POA: Diagnosis not present

## 2022-06-02 DIAGNOSIS — F4320 Adjustment disorder, unspecified: Secondary | ICD-10-CM | POA: Diagnosis not present

## 2022-06-02 DIAGNOSIS — G4700 Insomnia, unspecified: Secondary | ICD-10-CM | POA: Diagnosis not present

## 2022-06-02 DIAGNOSIS — F1120 Opioid dependence, uncomplicated: Secondary | ICD-10-CM | POA: Diagnosis not present

## 2022-06-02 DIAGNOSIS — F419 Anxiety disorder, unspecified: Secondary | ICD-10-CM | POA: Diagnosis not present

## 2022-06-04 DIAGNOSIS — G4700 Insomnia, unspecified: Secondary | ICD-10-CM | POA: Diagnosis not present

## 2022-06-04 DIAGNOSIS — F1120 Opioid dependence, uncomplicated: Secondary | ICD-10-CM | POA: Diagnosis not present

## 2022-06-04 DIAGNOSIS — F419 Anxiety disorder, unspecified: Secondary | ICD-10-CM | POA: Diagnosis not present

## 2022-06-04 DIAGNOSIS — F319 Bipolar disorder, unspecified: Secondary | ICD-10-CM | POA: Diagnosis not present

## 2022-06-09 DIAGNOSIS — F4320 Adjustment disorder, unspecified: Secondary | ICD-10-CM | POA: Diagnosis not present

## 2022-06-09 DIAGNOSIS — G4700 Insomnia, unspecified: Secondary | ICD-10-CM | POA: Diagnosis not present

## 2022-06-09 DIAGNOSIS — F1120 Opioid dependence, uncomplicated: Secondary | ICD-10-CM | POA: Diagnosis not present

## 2022-06-09 DIAGNOSIS — F419 Anxiety disorder, unspecified: Secondary | ICD-10-CM | POA: Diagnosis not present

## 2022-06-10 DIAGNOSIS — F319 Bipolar disorder, unspecified: Secondary | ICD-10-CM | POA: Diagnosis not present

## 2022-06-10 DIAGNOSIS — F419 Anxiety disorder, unspecified: Secondary | ICD-10-CM | POA: Diagnosis not present

## 2022-06-10 DIAGNOSIS — F1120 Opioid dependence, uncomplicated: Secondary | ICD-10-CM | POA: Diagnosis not present

## 2022-06-16 DIAGNOSIS — F419 Anxiety disorder, unspecified: Secondary | ICD-10-CM | POA: Diagnosis not present

## 2022-06-16 DIAGNOSIS — F1120 Opioid dependence, uncomplicated: Secondary | ICD-10-CM | POA: Diagnosis not present

## 2022-06-16 DIAGNOSIS — G4700 Insomnia, unspecified: Secondary | ICD-10-CM | POA: Diagnosis not present

## 2022-06-16 DIAGNOSIS — F319 Bipolar disorder, unspecified: Secondary | ICD-10-CM | POA: Diagnosis not present

## 2022-06-16 DIAGNOSIS — F4320 Adjustment disorder, unspecified: Secondary | ICD-10-CM | POA: Diagnosis not present

## 2022-06-17 DIAGNOSIS — F4320 Adjustment disorder, unspecified: Secondary | ICD-10-CM | POA: Diagnosis not present

## 2022-06-17 DIAGNOSIS — F419 Anxiety disorder, unspecified: Secondary | ICD-10-CM | POA: Diagnosis not present

## 2022-06-17 DIAGNOSIS — F1120 Opioid dependence, uncomplicated: Secondary | ICD-10-CM | POA: Diagnosis not present

## 2022-06-17 DIAGNOSIS — F319 Bipolar disorder, unspecified: Secondary | ICD-10-CM | POA: Diagnosis not present

## 2022-06-23 DIAGNOSIS — F1120 Opioid dependence, uncomplicated: Secondary | ICD-10-CM | POA: Diagnosis not present

## 2022-06-24 DIAGNOSIS — F1120 Opioid dependence, uncomplicated: Secondary | ICD-10-CM | POA: Diagnosis not present

## 2022-06-30 DIAGNOSIS — G4700 Insomnia, unspecified: Secondary | ICD-10-CM | POA: Diagnosis not present

## 2022-06-30 DIAGNOSIS — F419 Anxiety disorder, unspecified: Secondary | ICD-10-CM | POA: Diagnosis not present

## 2022-06-30 DIAGNOSIS — F4320 Adjustment disorder, unspecified: Secondary | ICD-10-CM | POA: Diagnosis not present

## 2022-06-30 DIAGNOSIS — F1120 Opioid dependence, uncomplicated: Secondary | ICD-10-CM | POA: Diagnosis not present

## 2022-07-01 DIAGNOSIS — F419 Anxiety disorder, unspecified: Secondary | ICD-10-CM | POA: Diagnosis not present

## 2022-07-01 DIAGNOSIS — F1120 Opioid dependence, uncomplicated: Secondary | ICD-10-CM | POA: Diagnosis not present

## 2022-07-01 DIAGNOSIS — F319 Bipolar disorder, unspecified: Secondary | ICD-10-CM | POA: Diagnosis not present

## 2022-07-07 ENCOUNTER — Encounter: Payer: Self-pay | Admitting: Family Medicine

## 2022-07-07 ENCOUNTER — Ambulatory Visit: Payer: BC Managed Care – PPO | Admitting: Family Medicine

## 2022-07-07 VITALS — BP 121/73 | HR 84 | Temp 97.0°F | Ht 69.0 in | Wt 232.2 lb

## 2022-07-07 DIAGNOSIS — G4700 Insomnia, unspecified: Secondary | ICD-10-CM | POA: Diagnosis not present

## 2022-07-07 DIAGNOSIS — F1120 Opioid dependence, uncomplicated: Secondary | ICD-10-CM | POA: Diagnosis not present

## 2022-07-07 DIAGNOSIS — K219 Gastro-esophageal reflux disease without esophagitis: Secondary | ICD-10-CM

## 2022-07-07 DIAGNOSIS — F39 Unspecified mood [affective] disorder: Secondary | ICD-10-CM

## 2022-07-07 DIAGNOSIS — G479 Sleep disorder, unspecified: Secondary | ICD-10-CM

## 2022-07-07 DIAGNOSIS — F1911 Other psychoactive substance abuse, in remission: Secondary | ICD-10-CM

## 2022-07-07 DIAGNOSIS — F419 Anxiety disorder, unspecified: Secondary | ICD-10-CM | POA: Diagnosis not present

## 2022-07-07 DIAGNOSIS — F4320 Adjustment disorder, unspecified: Secondary | ICD-10-CM | POA: Diagnosis not present

## 2022-07-07 MED ORDER — PANTOPRAZOLE SODIUM 40 MG PO TBEC: 40.0000 mg | DELAYED_RELEASE_TABLET | Freq: Every day | ORAL | 0 refills | Status: DC
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
## 2022-07-07 NOTE — Patient Instructions (Signed)
Fasting labs and EKG at next visit YES, I will prescribe the Seroquel I HIGHLY recommend you consider seeing a therapist privately.

## 2022-07-08 DIAGNOSIS — F419 Anxiety disorder, unspecified: Secondary | ICD-10-CM | POA: Diagnosis not present

## 2022-07-08 DIAGNOSIS — F1120 Opioid dependence, uncomplicated: Secondary | ICD-10-CM | POA: Diagnosis not present

## 2022-07-08 DIAGNOSIS — F319 Bipolar disorder, unspecified: Secondary | ICD-10-CM | POA: Diagnosis not present

## 2022-07-08 DIAGNOSIS — F4320 Adjustment disorder, unspecified: Secondary | ICD-10-CM | POA: Diagnosis not present

## 2022-07-14 DIAGNOSIS — F17200 Nicotine dependence, unspecified, uncomplicated: Secondary | ICD-10-CM | POA: Diagnosis not present

## 2022-07-14 DIAGNOSIS — F4320 Adjustment disorder, unspecified: Secondary | ICD-10-CM | POA: Diagnosis not present

## 2022-07-14 DIAGNOSIS — G4700 Insomnia, unspecified: Secondary | ICD-10-CM | POA: Diagnosis not present

## 2022-07-14 DIAGNOSIS — F419 Anxiety disorder, unspecified: Secondary | ICD-10-CM | POA: Diagnosis not present

## 2022-07-15 DIAGNOSIS — F1120 Opioid dependence, uncomplicated: Secondary | ICD-10-CM | POA: Diagnosis not present

## 2022-07-15 DIAGNOSIS — F319 Bipolar disorder, unspecified: Secondary | ICD-10-CM | POA: Diagnosis not present

## 2022-07-15 DIAGNOSIS — F419 Anxiety disorder, unspecified: Secondary | ICD-10-CM | POA: Diagnosis not present

## 2022-07-15 DIAGNOSIS — F4320 Adjustment disorder, unspecified: Secondary | ICD-10-CM | POA: Diagnosis not present

## 2022-07-28 DIAGNOSIS — F419 Anxiety disorder, unspecified: Secondary | ICD-10-CM | POA: Diagnosis not present

## 2022-07-28 DIAGNOSIS — G4700 Insomnia, unspecified: Secondary | ICD-10-CM | POA: Diagnosis not present

## 2022-07-28 DIAGNOSIS — F1120 Opioid dependence, uncomplicated: Secondary | ICD-10-CM | POA: Diagnosis not present

## 2022-07-28 DIAGNOSIS — F319 Bipolar disorder, unspecified: Secondary | ICD-10-CM | POA: Diagnosis not present

## 2022-07-28 DIAGNOSIS — F4320 Adjustment disorder, unspecified: Secondary | ICD-10-CM | POA: Diagnosis not present

## 2022-07-29 DIAGNOSIS — F1120 Opioid dependence, uncomplicated: Secondary | ICD-10-CM | POA: Diagnosis not present

## 2022-08-09 DIAGNOSIS — F319 Bipolar disorder, unspecified: Secondary | ICD-10-CM | POA: Diagnosis not present

## 2022-08-09 DIAGNOSIS — F1120 Opioid dependence, uncomplicated: Secondary | ICD-10-CM | POA: Diagnosis not present

## 2022-08-09 DIAGNOSIS — G4700 Insomnia, unspecified: Secondary | ICD-10-CM | POA: Diagnosis not present

## 2022-08-09 DIAGNOSIS — F4320 Adjustment disorder, unspecified: Secondary | ICD-10-CM | POA: Diagnosis not present

## 2022-08-09 DIAGNOSIS — F419 Anxiety disorder, unspecified: Secondary | ICD-10-CM | POA: Diagnosis not present

## 2022-08-10 ENCOUNTER — Encounter: Payer: Self-pay | Admitting: Family Medicine

## 2022-08-10 ENCOUNTER — Ambulatory Visit (INDEPENDENT_AMBULATORY_CARE_PROVIDER_SITE_OTHER): Payer: BC Managed Care – PPO | Admitting: Family Medicine

## 2022-08-10 VITALS — BP 108/64 | HR 89 | Temp 98.4°F | Ht 69.0 in | Wt 236.5 lb

## 2022-08-10 DIAGNOSIS — R0609 Other forms of dyspnea: Secondary | ICD-10-CM | POA: Diagnosis not present

## 2022-08-10 DIAGNOSIS — R1312 Dysphagia, oropharyngeal phase: Secondary | ICD-10-CM

## 2022-08-10 DIAGNOSIS — Z0001 Encounter for general adult medical examination with abnormal findings: Secondary | ICD-10-CM

## 2022-08-10 DIAGNOSIS — K219 Gastro-esophageal reflux disease without esophagitis: Secondary | ICD-10-CM

## 2022-08-10 DIAGNOSIS — E162 Hypoglycemia, unspecified: Secondary | ICD-10-CM

## 2022-08-10 DIAGNOSIS — Z0000 Encounter for general adult medical examination without abnormal findings: Secondary | ICD-10-CM

## 2022-08-10 DIAGNOSIS — Z125 Encounter for screening for malignant neoplasm of prostate: Secondary | ICD-10-CM | POA: Diagnosis not present

## 2022-08-10 DIAGNOSIS — G479 Sleep disorder, unspecified: Secondary | ICD-10-CM

## 2022-08-10 DIAGNOSIS — Z5181 Encounter for therapeutic drug level monitoring: Secondary | ICD-10-CM

## 2022-08-10 DIAGNOSIS — F39 Unspecified mood [affective] disorder: Secondary | ICD-10-CM

## 2022-08-10 DIAGNOSIS — Z720 Tobacco use: Secondary | ICD-10-CM

## 2022-08-10 MED ORDER — BREZTRI AEROSPHERE 160-9-4.8 MCG/ACT IN AERO: 2.0000 | INHALATION_SPRAY | Freq: Two times a day (BID) | RESPIRATORY_TRACT | 0 refills | Status: DC

## 2022-08-10 MED ORDER — ALBUTEROL SULFATE HFA 108 (90 BASE) MCG/ACT IN AERS: 2.0000 | INHALATION_SPRAY | Freq: Four times a day (QID) | RESPIRATORY_TRACT | 0 refills | Status: DC | PRN

## 2022-08-10 NOTE — Patient Instructions (Addendum)
Albuterol sent to pharmacy for AS needed use.  If using more than 2 days per week, start ONE of the samples I gave you and use DAILY as directed.    Breztri: 2 puffs twice daily. Rinse mouth out after use Stiolto: 2 puffs ONCE daily. Rinse mouth after use.
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
## 2022-08-11 ENCOUNTER — Encounter (INDEPENDENT_AMBULATORY_CARE_PROVIDER_SITE_OTHER): Payer: Self-pay | Admitting: *Deleted

## 2022-08-12 DIAGNOSIS — F1120 Opioid dependence, uncomplicated: Secondary | ICD-10-CM | POA: Diagnosis not present

## 2022-08-12 DIAGNOSIS — F319 Bipolar disorder, unspecified: Secondary | ICD-10-CM | POA: Diagnosis not present

## 2022-08-12 DIAGNOSIS — F419 Anxiety disorder, unspecified: Secondary | ICD-10-CM | POA: Diagnosis not present

## 2022-08-12 DIAGNOSIS — F17200 Nicotine dependence, unspecified, uncomplicated: Secondary | ICD-10-CM | POA: Diagnosis not present

## 2022-08-18 DIAGNOSIS — F1120 Opioid dependence, uncomplicated: Secondary | ICD-10-CM | POA: Diagnosis not present

## 2022-08-18 DIAGNOSIS — F4320 Adjustment disorder, unspecified: Secondary | ICD-10-CM | POA: Diagnosis not present

## 2022-08-18 DIAGNOSIS — F419 Anxiety disorder, unspecified: Secondary | ICD-10-CM | POA: Diagnosis not present

## 2022-08-18 DIAGNOSIS — G4700 Insomnia, unspecified: Secondary | ICD-10-CM | POA: Diagnosis not present

## 2022-08-20 ENCOUNTER — Other Ambulatory Visit: Payer: BC Managed Care – PPO

## 2022-08-20 DIAGNOSIS — Z5181 Encounter for therapeutic drug level monitoring: Secondary | ICD-10-CM

## 2022-08-20 DIAGNOSIS — Z125 Encounter for screening for malignant neoplasm of prostate: Secondary | ICD-10-CM

## 2022-08-20 DIAGNOSIS — F39 Unspecified mood [affective] disorder: Secondary | ICD-10-CM | POA: Diagnosis not present

## 2022-08-20 DIAGNOSIS — R739 Hyperglycemia, unspecified: Secondary | ICD-10-CM | POA: Diagnosis not present

## 2022-08-20 LAB — BAYER DCA HB A1C WAIVED: HB A1C (BAYER DCA - WAIVED): 6.2 % — ABNORMAL HIGH (ref 4.8–5.6)

## 2022-08-21 LAB — CMP14+EGFR
ALT: 14 IU/L (ref 0–44)
AST: 16 IU/L (ref 0–40)
Albumin/Globulin Ratio: 2.5 — ABNORMAL HIGH (ref 1.2–2.2)
Albumin: 4.2 g/dL (ref 4.1–5.1)
Alkaline Phosphatase: 104 IU/L (ref 44–121)
BUN/Creatinine Ratio: 16 (ref 9–20)
BUN: 14 mg/dL (ref 6–24)
Bilirubin Total: 0.2 mg/dL (ref 0.0–1.2)
CO2: 21 mmol/L (ref 20–29)
Calcium: 8.8 mg/dL (ref 8.7–10.2)
Chloride: 103 mmol/L (ref 96–106)
Creatinine, Ser: 0.89 mg/dL (ref 0.76–1.27)
Globulin, Total: 1.7 g/dL (ref 1.5–4.5)
Glucose: 105 mg/dL — ABNORMAL HIGH (ref 70–99)
Potassium: 3.9 mmol/L (ref 3.5–5.2)
Sodium: 138 mmol/L (ref 134–144)
Total Protein: 5.9 g/dL — ABNORMAL LOW (ref 6.0–8.5)
eGFR: 105 mL/min/{1.73_m2} (ref >59)

## 2022-08-21 LAB — CBC
Hematocrit: 40.1 % (ref 37.5–51.0)
Hemoglobin: 13.5 g/dL (ref 13.0–17.7)
MCH: 30.5 pg (ref 26.6–33.0)
MCHC: 33.7 g/dL (ref 31.5–35.7)
MCV: 91 fL (ref 79–97)
Platelets: 281 10*3/uL (ref 150–450)
RBC: 4.42 x10E6/uL (ref 4.14–5.80)
RDW: 13.2 % (ref 11.6–15.4)
WBC: 8 10*3/uL (ref 3.4–10.8)

## 2022-08-21 LAB — LIPID PANEL
Chol/HDL Ratio: 4.8 ratio (ref 0.0–5.0)
Cholesterol, Total: 184 mg/dL (ref 100–199)
HDL: 38 mg/dL — ABNORMAL LOW (ref >39)
LDL Chol Calc (NIH): 131 mg/dL — ABNORMAL HIGH (ref 0–99)
Triglycerides: 80 mg/dL (ref 0–149)
VLDL Cholesterol Cal: 15 mg/dL (ref 5–40)

## 2022-08-21 LAB — PSA: Prostate Specific Ag, Serum: 13.1 ng/mL — ABNORMAL HIGH (ref 0.0–4.0)

## 2022-08-21 LAB — TSH: TSH: 2.1 u[IU]/mL (ref 0.450–4.500)

## 2022-08-22 DIAGNOSIS — S86812A Strain of other muscle(s) and tendon(s) at lower leg level, left leg, initial encounter: Secondary | ICD-10-CM | POA: Diagnosis not present

## 2022-08-22 DIAGNOSIS — M79662 Pain in left lower leg: Secondary | ICD-10-CM | POA: Diagnosis not present

## 2022-08-23 ENCOUNTER — Other Ambulatory Visit: Payer: Self-pay | Admitting: *Deleted

## 2022-08-23 DIAGNOSIS — R972 Elevated prostate specific antigen [PSA]: Secondary | ICD-10-CM

## 2022-08-24 DIAGNOSIS — S86112D Strain of other muscle(s) and tendon(s) of posterior muscle group at lower leg level, left leg, subsequent encounter: Secondary | ICD-10-CM | POA: Diagnosis not present

## 2022-08-25 DIAGNOSIS — G4700 Insomnia, unspecified: Secondary | ICD-10-CM | POA: Diagnosis not present

## 2022-08-25 DIAGNOSIS — F4320 Adjustment disorder, unspecified: Secondary | ICD-10-CM | POA: Diagnosis not present

## 2022-08-25 DIAGNOSIS — F419 Anxiety disorder, unspecified: Secondary | ICD-10-CM | POA: Diagnosis not present

## 2022-08-25 DIAGNOSIS — F1120 Opioid dependence, uncomplicated: Secondary | ICD-10-CM | POA: Diagnosis not present

## 2022-08-26 DIAGNOSIS — F419 Anxiety disorder, unspecified: Secondary | ICD-10-CM | POA: Diagnosis not present

## 2022-08-26 DIAGNOSIS — F319 Bipolar disorder, unspecified: Secondary | ICD-10-CM | POA: Diagnosis not present

## 2022-08-26 DIAGNOSIS — F1120 Opioid dependence, uncomplicated: Secondary | ICD-10-CM | POA: Diagnosis not present

## 2022-09-06 DIAGNOSIS — F1120 Opioid dependence, uncomplicated: Secondary | ICD-10-CM | POA: Diagnosis not present

## 2022-09-06 DIAGNOSIS — G4700 Insomnia, unspecified: Secondary | ICD-10-CM | POA: Diagnosis not present

## 2022-09-06 DIAGNOSIS — F4320 Adjustment disorder, unspecified: Secondary | ICD-10-CM | POA: Diagnosis not present

## 2022-09-06 DIAGNOSIS — F419 Anxiety disorder, unspecified: Secondary | ICD-10-CM | POA: Diagnosis not present

## 2022-09-09 DIAGNOSIS — F419 Anxiety disorder, unspecified: Secondary | ICD-10-CM | POA: Diagnosis not present

## 2022-09-09 DIAGNOSIS — F1120 Opioid dependence, uncomplicated: Secondary | ICD-10-CM | POA: Diagnosis not present

## 2022-09-09 DIAGNOSIS — F319 Bipolar disorder, unspecified: Secondary | ICD-10-CM | POA: Diagnosis not present

## 2022-09-22 DIAGNOSIS — F419 Anxiety disorder, unspecified: Secondary | ICD-10-CM | POA: Diagnosis not present

## 2022-09-22 DIAGNOSIS — G4700 Insomnia, unspecified: Secondary | ICD-10-CM | POA: Diagnosis not present

## 2022-09-22 DIAGNOSIS — F4320 Adjustment disorder, unspecified: Secondary | ICD-10-CM | POA: Diagnosis not present

## 2022-09-22 DIAGNOSIS — F1120 Opioid dependence, uncomplicated: Secondary | ICD-10-CM | POA: Diagnosis not present

## 2022-09-23 DIAGNOSIS — F419 Anxiety disorder, unspecified: Secondary | ICD-10-CM | POA: Diagnosis not present

## 2022-09-23 DIAGNOSIS — F1120 Opioid dependence, uncomplicated: Secondary | ICD-10-CM | POA: Diagnosis not present

## 2022-09-23 DIAGNOSIS — F17200 Nicotine dependence, unspecified, uncomplicated: Secondary | ICD-10-CM | POA: Diagnosis not present

## 2022-10-02 ENCOUNTER — Other Ambulatory Visit: Payer: Self-pay | Admitting: Family Medicine

## 2022-10-02 DIAGNOSIS — K219 Gastro-esophageal reflux disease without esophagitis: Secondary | ICD-10-CM

## 2022-10-06 DIAGNOSIS — F419 Anxiety disorder, unspecified: Secondary | ICD-10-CM | POA: Diagnosis not present

## 2022-10-06 DIAGNOSIS — F319 Bipolar disorder, unspecified: Secondary | ICD-10-CM | POA: Diagnosis not present

## 2022-10-06 DIAGNOSIS — F1120 Opioid dependence, uncomplicated: Secondary | ICD-10-CM | POA: Diagnosis not present

## 2022-10-06 DIAGNOSIS — G4700 Insomnia, unspecified: Secondary | ICD-10-CM | POA: Diagnosis not present

## 2022-10-06 DIAGNOSIS — F4320 Adjustment disorder, unspecified: Secondary | ICD-10-CM | POA: Diagnosis not present

## 2022-10-07 DIAGNOSIS — F419 Anxiety disorder, unspecified: Secondary | ICD-10-CM | POA: Diagnosis not present

## 2022-10-07 DIAGNOSIS — F1120 Opioid dependence, uncomplicated: Secondary | ICD-10-CM | POA: Diagnosis not present

## 2022-10-07 DIAGNOSIS — F319 Bipolar disorder, unspecified: Secondary | ICD-10-CM | POA: Diagnosis not present

## 2022-10-11 ENCOUNTER — Ambulatory Visit (INDEPENDENT_AMBULATORY_CARE_PROVIDER_SITE_OTHER): Payer: BC Managed Care – PPO | Admitting: Gastroenterology
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
## 2022-10-12 ENCOUNTER — Encounter: Payer: Self-pay | Admitting: Urology

## 2022-10-12 ENCOUNTER — Ambulatory Visit: Payer: BC Managed Care – PPO | Admitting: Urology

## 2022-10-12 VITALS — BP 121/83 | HR 96

## 2022-10-12 DIAGNOSIS — R3129 Other microscopic hematuria: Secondary | ICD-10-CM

## 2022-10-12 DIAGNOSIS — N400 Enlarged prostate without lower urinary tract symptoms: Secondary | ICD-10-CM | POA: Diagnosis not present

## 2022-10-12 DIAGNOSIS — N529 Male erectile dysfunction, unspecified: Secondary | ICD-10-CM

## 2022-10-12 DIAGNOSIS — R972 Elevated prostate specific antigen [PSA]: Secondary | ICD-10-CM

## 2022-10-12 LAB — URINALYSIS, ROUTINE W REFLEX MICROSCOPIC
Bilirubin, UA: NEGATIVE
Glucose, UA: NEGATIVE
Ketones, UA: NEGATIVE
Leukocytes,UA: NEGATIVE
Nitrite, UA: NEGATIVE
Protein,UA: NEGATIVE
Specific Gravity, UA: 1.02 (ref 1.005–1.030)
Urobilinogen, Ur: 0.2 mg/dL (ref 0.2–1.0)
pH, UA: 6 (ref 5.0–7.5)

## 2022-10-12 LAB — MICROSCOPIC EXAMINATION: Bacteria, UA: NONE SEEN

## 2022-10-13 LAB — PSA: Prostate Specific Ag, Serum: 15.9 ng/mL — ABNORMAL HIGH (ref 0.0–4.0)

## 2022-10-19 ENCOUNTER — Telehealth: Payer: Self-pay

## 2022-10-19 ENCOUNTER — Other Ambulatory Visit: Payer: Self-pay | Admitting: Urology

## 2022-10-19 DIAGNOSIS — R972 Elevated prostate specific antigen [PSA]: Secondary | ICD-10-CM

## 2022-10-19 MED ORDER — AMOXICILLIN-POT CLAVULANATE 875-125 MG PO TABS: ORAL_TABLET | ORAL | 0 refills | Status: DC

## 2022-10-19 NOTE — Telephone Encounter (Signed)
OPEN IN ERROR 

## 2022-10-20 ENCOUNTER — Telehealth: Payer: Self-pay

## 2022-10-20 DIAGNOSIS — F4320 Adjustment disorder, unspecified: Secondary | ICD-10-CM | POA: Diagnosis not present

## 2022-10-20 DIAGNOSIS — R972 Elevated prostate specific antigen [PSA]: Secondary | ICD-10-CM

## 2022-10-20 DIAGNOSIS — G4700 Insomnia, unspecified: Secondary | ICD-10-CM | POA: Diagnosis not present

## 2022-10-20 DIAGNOSIS — F419 Anxiety disorder, unspecified: Secondary | ICD-10-CM | POA: Diagnosis not present

## 2022-10-20 DIAGNOSIS — F1120 Opioid dependence, uncomplicated: Secondary | ICD-10-CM | POA: Diagnosis not present

## 2022-10-20 NOTE — Telephone Encounter (Signed)
-----   Message from Marcine Matar, MD sent at 10/19/2022 12:18 PM EDT ----- Please call patient-PSA is 15.9.  I would suggest that we proceed with ultrasound and biopsy.  I put ultrasound and antibiotic orders in please schedule. ----- Message ----- From: Troy Sine, CMA Sent: 10/13/2022   8:15 AM EDT To: Marcine Matar, MD  Please review

## 2022-10-20 NOTE — Telephone Encounter (Signed)
Patient's wife is aware of Dr. Retta Diones recommendation. Patient agree to proceed with Biopsy and ultrasound. Biopsy instruction given over the phone and mailed to patient. Patient's wife voiced understanding

## 2022-10-21 DIAGNOSIS — F1120 Opioid dependence, uncomplicated: Secondary | ICD-10-CM | POA: Diagnosis not present

## 2022-10-21 DIAGNOSIS — F319 Bipolar disorder, unspecified: Secondary | ICD-10-CM | POA: Diagnosis not present

## 2022-10-21 DIAGNOSIS — F410 Panic disorder [episodic paroxysmal anxiety] without agoraphobia: Secondary | ICD-10-CM | POA: Diagnosis not present

## 2022-10-21 DIAGNOSIS — F419 Anxiety disorder, unspecified: Secondary | ICD-10-CM | POA: Diagnosis not present

## 2022-10-27 ENCOUNTER — Encounter: Payer: Self-pay | Admitting: Family Medicine

## 2022-10-27 ENCOUNTER — Telehealth: Payer: Self-pay | Admitting: Family Medicine

## 2022-10-27 ENCOUNTER — Ambulatory Visit (INDEPENDENT_AMBULATORY_CARE_PROVIDER_SITE_OTHER): Payer: BC Managed Care – PPO | Admitting: Family Medicine

## 2022-10-27 VITALS — BP 144/88 | HR 100 | Temp 98.6°F | Ht 69.0 in | Wt 241.0 lb

## 2022-10-27 DIAGNOSIS — G479 Sleep disorder, unspecified: Secondary | ICD-10-CM | POA: Diagnosis not present

## 2022-10-27 DIAGNOSIS — R440 Auditory hallucinations: Secondary | ICD-10-CM

## 2022-10-27 DIAGNOSIS — F39 Unspecified mood [affective] disorder: Secondary | ICD-10-CM

## 2022-10-27 DIAGNOSIS — Z0289 Encounter for other administrative examinations: Secondary | ICD-10-CM

## 2022-10-27 NOTE — Telephone Encounter (Signed)
Patient dropped off FMLA forms to be completed and signed.  Form Fee Paid? (Y/N)       yes
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
## 2022-11-02 ENCOUNTER — Other Ambulatory Visit: Payer: Self-pay | Admitting: Urology

## 2022-11-02 ENCOUNTER — Ambulatory Visit (HOSPITAL_COMMUNITY)
Admission: RE | Admit: 2022-11-02 | Discharge: 2022-11-02 | Disposition: A | Discharge status: Home / Self Care | Payer: BC Managed Care – PPO | Source: Ambulatory Visit | Attending: Urology | Admitting: Urology

## 2022-11-02 ENCOUNTER — Encounter: Payer: Self-pay | Admitting: Urology

## 2022-11-02 ENCOUNTER — Encounter (HOSPITAL_COMMUNITY): Payer: Self-pay

## 2022-11-02 ENCOUNTER — Ambulatory Visit (HOSPITAL_BASED_OUTPATIENT_CLINIC_OR_DEPARTMENT_OTHER): Payer: BC Managed Care – PPO | Admitting: Urology

## 2022-11-02 VITALS — BP 112/65 | HR 78 | Temp 98.0°F | Resp 18

## 2022-11-02 DIAGNOSIS — R972 Elevated prostate specific antigen [PSA]: Secondary | ICD-10-CM

## 2022-11-02 DIAGNOSIS — N5089 Other specified disorders of the male genital organs: Secondary | ICD-10-CM

## 2022-11-02 DIAGNOSIS — C61 Malignant neoplasm of prostate: Secondary | ICD-10-CM | POA: Diagnosis not present

## 2022-11-02 DIAGNOSIS — N4289 Other specified disorders of prostate: Secondary | ICD-10-CM | POA: Diagnosis not present

## 2022-11-02 DIAGNOSIS — N433 Hydrocele, unspecified: Secondary | ICD-10-CM | POA: Diagnosis not present

## 2022-11-02 MED ORDER — GENTAMICIN SULFATE 40 MG/ML IJ SOLN
160.0000 mg | Freq: Once | INTRAMUSCULAR | Status: AC
  Administered 2022-11-02: 160 mg via INTRAMUSCULAR

## 2022-11-02 MED ORDER — LIDOCAINE HCL (PF) 2 % IJ SOLN
INTRAMUSCULAR | Status: AC
  Filled 2022-11-02: qty 10

## 2022-11-02 MED ORDER — GENTAMICIN SULFATE 40 MG/ML IJ SOLN
INTRAMUSCULAR | Status: AC
  Filled 2022-11-02: qty 4

## 2022-11-02 MED ORDER — LIDOCAINE HCL (PF) 2 % IJ SOLN
10.0000 mL | Freq: Once | INTRAMUSCULAR | Status: AC
  Administered 2022-11-02: 10 mL

## 2022-11-02 NOTE — Progress Notes (Signed)
Prostate Biopsy Procedure   Informed consent was obtained after discussing risks/benefits of the procedure.  A time out was performed to ensure correct patient identity.  Pre-Procedure: - Last PSA Level: No results found for: "PSA" - Gentamicin given prophylactically - Levaquin 500 mg administered PO -Transrectal Ultrasound performed revealing a 43.2 gm prostate -No significant hypoechoic or median lobe noted  Procedure: - Prostate block performed using 10 cc 1% lidocaine and biopsies taken from sextant areas, a total of 12 under ultrasound guidance.  Post-Procedure: - Patient tolerated the procedure well - He was counseled to seek immediate medical attention if experiences any severe pain, significant bleeding, or fevers - Return in one week to discuss biopsy results

## 2022-11-02 NOTE — Patient Instructions (Signed)
Transrectal Ultrasound-Guided Prostate Biopsy, Care After What can I expect after the procedure? After the procedure, it is common to have: Pain and discomfort near your butt (rectum), especially while sitting. Pink-colored pee (urine). This is due to small amounts of blood in your pee. A burning feeling while peeing. Blood in your poop (stool). Bleeding from your butt. Blood in your semen. Follow these instructions at home: Medicines Take over-the-counter and prescription medicines only as told by your doctor. If you were given a sedative during your procedure, do not drive or use machines until your doctor says that it is safe. A sedative is a medicine that helps you relax. If you were prescribed an antibiotic medicine, take it as told by your doctor. Do not stop taking it even if you start to feel better. Activity  Return to your normal activities when your doctor says that it is safe. Ask your doctor when it is okay for you to have sex. You may have to avoid lifting. Ask your doctor how much you can safely lift. General instructions  Drink enough water to keep your pee pale yellow. Watch your pee, poop, and semen for new bleeding or bleeding that gets worse. Keep all follow-up visits. Contact a doctor if: You have any of these: Blood clots in your pee or poop. Blood in your pee more than 2 weeks after the procedure. Blood in your semen more than 2 months after the procedure. New or worse bleeding in your pee, poop, or semen. Very bad belly pain. Your pee smells bad or unusual. You have trouble peeing. Your lower belly feels firm. You have problems getting an erection. You feel like you may vomit (are nauseous), or you vomit. Get help right away if: You have a fever or chills. You have bright red pee. You have very bad pain that does not get better with medicine. You cannot pee. Summary After this procedure, it is common to have pain and discomfort near your butt,  especially while sitting. You may have blood in your pee and poop. It is common to have blood in your semen. Get help right away if you have a fever or chills. This information is not intended to replace advice given to you by your health care provider. Make sure you discuss any questions you have with your health care provider. Document Revised: 12/01/2020 Document Reviewed: 12/01/2020 Elsevier Patient Education  2023 Elsevier Inc.  

## 2022-11-02 NOTE — Progress Notes (Signed)
Patient tolerated ultrasound guided prostate by Dr. Ronne Binning well today. Patient verbalized understanding of discharge instructions and left with wife from waiting area with no acute distress noted. Specimens taken to lab by Richard from ultrasound at 1123.

## 2022-11-03 DIAGNOSIS — F1120 Opioid dependence, uncomplicated: Secondary | ICD-10-CM | POA: Diagnosis not present

## 2022-11-03 DIAGNOSIS — F419 Anxiety disorder, unspecified: Secondary | ICD-10-CM | POA: Diagnosis not present

## 2022-11-03 DIAGNOSIS — G4700 Insomnia, unspecified: Secondary | ICD-10-CM | POA: Diagnosis not present

## 2022-11-03 DIAGNOSIS — F319 Bipolar disorder, unspecified: Secondary | ICD-10-CM | POA: Diagnosis not present

## 2022-11-03 DIAGNOSIS — F4320 Adjustment disorder, unspecified: Secondary | ICD-10-CM | POA: Diagnosis not present

## 2022-11-10 ENCOUNTER — Encounter: Payer: Self-pay | Admitting: Family Medicine

## 2022-11-10 ENCOUNTER — Ambulatory Visit: Payer: BC Managed Care – PPO | Admitting: Family Medicine

## 2022-11-10 VITALS — BP 105/73 | HR 83 | Temp 98.6°F | Ht 69.0 in | Wt 237.0 lb

## 2022-11-10 DIAGNOSIS — F39 Unspecified mood [affective] disorder: Secondary | ICD-10-CM | POA: Diagnosis not present

## 2022-11-10 DIAGNOSIS — C61 Malignant neoplasm of prostate: Secondary | ICD-10-CM | POA: Diagnosis not present

## 2022-11-10 DIAGNOSIS — G479 Sleep disorder, unspecified: Secondary | ICD-10-CM
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
## 2022-11-11 DIAGNOSIS — F1120 Opioid dependence, uncomplicated: Secondary | ICD-10-CM | POA: Diagnosis not present

## 2022-11-11 DIAGNOSIS — F410 Panic disorder [episodic paroxysmal anxiety] without agoraphobia: Secondary | ICD-10-CM | POA: Diagnosis not present

## 2022-11-11 DIAGNOSIS — F319 Bipolar disorder, unspecified: Secondary | ICD-10-CM | POA: Diagnosis not present

## 2022-11-11 DIAGNOSIS — F419 Anxiety disorder, unspecified: Secondary | ICD-10-CM | POA: Diagnosis not present

## 2022-11-16 DIAGNOSIS — F4320 Adjustment disorder, unspecified: Secondary | ICD-10-CM | POA: Diagnosis not present

## 2022-11-16 DIAGNOSIS — F419 Anxiety disorder, unspecified: Secondary | ICD-10-CM | POA: Diagnosis not present

## 2022-11-16 DIAGNOSIS — G4700 Insomnia, unspecified: Secondary | ICD-10-CM | POA: Diagnosis not present

## 2022-11-16 DIAGNOSIS — F1120 Opioid dependence, uncomplicated: Secondary | ICD-10-CM | POA: Diagnosis not present

## 2022-11-17 ENCOUNTER — Ambulatory Visit (INDEPENDENT_AMBULATORY_CARE_PROVIDER_SITE_OTHER): Payer: BC Managed Care – PPO | Admitting: Urology

## 2022-11-17 ENCOUNTER — Encounter: Payer: Self-pay | Admitting: Urology

## 2022-11-17 VITALS — BP 124/80 | HR 67

## 2022-11-17 DIAGNOSIS — R972 Elevated prostate specific antigen [PSA]: Secondary | ICD-10-CM

## 2022-11-17 DIAGNOSIS — C61 Malignant neoplasm of prostate: Secondary | ICD-10-CM

## 2022-11-17 MED ORDER — TADALAFIL 20 MG PO TABS: 20.0000 mg | ORAL_TABLET | Freq: Every day | ORAL | 5 refills | Status: DC | PRN
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
## 2022-11-18 ENCOUNTER — Encounter: Payer: Self-pay | Admitting: Urology

## 2022-11-18 NOTE — Patient Instructions (Signed)

## 2022-11-24 ENCOUNTER — Telehealth: Payer: Self-pay | Admitting: Radiation Oncology

## 2022-11-24 NOTE — Telephone Encounter (Signed)
6/5 @ 8:06 am Left voicemail for patient to call our office to be schedule for consult.

## 2022-11-25 DIAGNOSIS — F319 Bipolar disorder, unspecified: Secondary | ICD-10-CM | POA: Diagnosis not present

## 2022-11-25 DIAGNOSIS — F419 Anxiety disorder, unspecified: Secondary | ICD-10-CM | POA: Diagnosis not present

## 2022-11-25 DIAGNOSIS — F17200 Nicotine dependence, unspecified, uncomplicated: Secondary | ICD-10-CM | POA: Diagnosis not present

## 2022-11-25 DIAGNOSIS — F1120 Opioid dependence, uncomplicated: Secondary | ICD-10-CM | POA: Diagnosis not present

## 2022-11-30 DIAGNOSIS — F1120 Opioid dependence, uncomplicated: Secondary | ICD-10-CM | POA: Diagnosis not present

## 2022-11-30 DIAGNOSIS — F410 Panic disorder [episodic paroxysmal anxiety] without agoraphobia: Secondary | ICD-10-CM | POA: Diagnosis not present

## 2022-11-30 DIAGNOSIS — F419 Anxiety disorder, unspecified: Secondary | ICD-10-CM | POA: Diagnosis not present

## 2022-11-30 DIAGNOSIS — G4700 Insomnia, unspecified: Secondary | ICD-10-CM | POA: Diagnosis not present

## 2022-12-01 ENCOUNTER — Encounter: Payer: Self-pay | Admitting: Radiation Oncology

## 2022-12-02 ENCOUNTER — Encounter (HOSPITAL_COMMUNITY): Admission: RE | Admit: 2022-12-02 | Payer: BC Managed Care – PPO | Source: Ambulatory Visit

## 2022-12-03 ENCOUNTER — Ambulatory Visit: Payer: BC Managed Care – PPO

## 2022-12-03 ENCOUNTER — Ambulatory Visit: Payer: BC Managed Care – PPO | Admitting: Radiation Oncology

## 2022-12-03 ENCOUNTER — Other Ambulatory Visit: Payer: Self-pay | Admitting: Urology

## 2022-12-03 DIAGNOSIS — C61 Malignant neoplasm of prostate: Secondary | ICD-10-CM

## 2022-12-09 ENCOUNTER — Encounter (HOSPITAL_COMMUNITY): Payer: BC Managed Care – PPO

## 2022-12-09 ENCOUNTER — Ambulatory Visit (HOSPITAL_COMMUNITY): Payer: BC Managed Care – PPO

## 2022-12-09 DIAGNOSIS — F1120 Opioid dependence, uncomplicated: Secondary | ICD-10-CM | POA: Diagnosis not present

## 2022-12-09 DIAGNOSIS — F319 Bipolar disorder, unspecified: Secondary | ICD-10-CM | POA: Diagnosis not present

## 2022-12-09 DIAGNOSIS — F419 Anxiety disorder, unspecified: Secondary | ICD-10-CM | POA: Diagnosis not present

## 2022-12-20 ENCOUNTER — Ambulatory Visit (HOSPITAL_BASED_OUTPATIENT_CLINIC_OR_DEPARTMENT_OTHER)
Admission: RE | Admit: 2022-12-20 | Discharge: 2022-12-20 | Disposition: A | Discharge status: Home / Self Care | Payer: BC Managed Care – PPO | Source: Ambulatory Visit | Attending: Urology | Admitting: Urology

## 2022-12-20 DIAGNOSIS — C61 Malignant neoplasm of prostate: Secondary | ICD-10-CM | POA: Insufficient documentation

## 2022-12-20 MED ORDER — IOHEXOL 300 MG/ML  SOLN
100.0000 mL | Freq: Once | INTRAMUSCULAR | Status: AC | PRN
  Administered 2022-12-20: 85 mL via INTRAVENOUS

## 2022-12-21 ENCOUNTER — Ambulatory Visit: Payer: BC Managed Care – PPO | Admitting: Radiation Oncology

## 2022-12-21 ENCOUNTER — Ambulatory Visit: Payer: BC Managed Care – PPO

## 2022-12-21 DIAGNOSIS — C61 Malignant neoplasm of prostate: Secondary | ICD-10-CM | POA: Insufficient documentation

## 2022-12-21 DIAGNOSIS — F1120 Opioid dependence, uncomplicated: Secondary | ICD-10-CM | POA: Diagnosis not present

## 2022-12-29 ENCOUNTER — Ambulatory Visit: Payer: BC Managed Care – PPO | Admitting: Urology

## 2022-12-29 DIAGNOSIS — F1120 Opioid dependence, uncomplicated: Secondary | ICD-10-CM | POA: Diagnosis not present

## 2022-12-29 DIAGNOSIS — G4700 Insomnia, unspecified: Secondary | ICD-10-CM | POA: Diagnosis not present

## 2022-12-29 DIAGNOSIS — F419 Anxiety disorder, unspecified: Secondary | ICD-10-CM | POA: Diagnosis not present

## 2022-12-29 DIAGNOSIS — F410 Panic disorder [episodic paroxysmal anxiety] without agoraphobia: Secondary | ICD-10-CM | POA: Diagnosis not present

## 2023-01-04 ENCOUNTER — Telehealth: Payer: Self-pay

## 2023-01-04 NOTE — Telephone Encounter (Signed)
Insurance denied PET scan  Per [employer], it does not meet their policy standard for medical necessity.   Per [employer] letter the test is indicated when all following criteria are met Risk for cancer cells spreading to other parts of the body is moderate or high Other test (such as MRI, bone scan and/or CT scan) provided unclear results Results of this PET scan will be used to decide further treatment (such as prostate removal and/or radiation)  Message sent to provider Letter scanned into media

## 2023-01-05 NOTE — Progress Notes (Signed)
GU Location of Tumor / Histology: Prostate Ca  If Prostate Cancer, Gleason Score is (4 + 3) and PSA is (15.9 on )   Biopsies       Past/Anticipated interventions by urology, if any:  Dr. Wilkie Aye Assessment & Plan:     Prostate Cancer I discussed the natural history of favorable intermediate risk prostate cancer with the patient and the various treatment options including active surveillance, RALP, IMRT, brachytherapy, cryotherapy, HIFU and ADT. PSMA PET ordered. Patient to meet with Dr. Kathrynn Running for consideration of radiation therapy and Dr. Berneice Heinrich for consideration of RALP.   No follow-ups on file.    Past/Anticipated interventions by medical oncology, if any: NA  Weight changes, if any: No  IPSS:  26 SHIM:  18  Bowel/Bladder complaints, if any:  No  Nausea/Vomiting, if any: No  Pain issues, if any:  0/10  SAFETY ISSUES: Prior radiation? No Pacemaker/ICD? No Possible current pregnancy? Male Is the patient on methotrexate? No  Current Complaints / other details:

## 2023-01-06 DIAGNOSIS — F1120 Opioid dependence, uncomplicated: Secondary | ICD-10-CM | POA: Diagnosis not present

## 2023-01-06 DIAGNOSIS — F419 Anxiety disorder, unspecified: Secondary | ICD-10-CM | POA: Diagnosis not present

## 2023-01-06 DIAGNOSIS — F319 Bipolar disorder, unspecified: Secondary | ICD-10-CM | POA: Diagnosis not present

## 2023-01-12 ENCOUNTER — Ambulatory Visit
Admission: RE | Admit: 2023-01-12 | Discharge: 2023-01-12 | Disposition: A | Discharge status: Home / Self Care | Payer: BC Managed Care – PPO | Source: Ambulatory Visit | Attending: Radiation Oncology | Admitting: Radiation Oncology

## 2023-01-12 ENCOUNTER — Encounter: Payer: Self-pay | Admitting: Radiation Oncology

## 2023-01-12 ENCOUNTER — Other Ambulatory Visit: Payer: Self-pay

## 2023-01-12 VITALS — BP 122/82 | HR 70 | Temp 97.5°F | Resp 18 | Ht 69.0 in | Wt 239.0 lb

## 2023-01-12 DIAGNOSIS — Z79899 Other long term (current) drug therapy: Secondary | ICD-10-CM | POA: Diagnosis not present

## 2023-01-12 DIAGNOSIS — C61 Malignant neoplasm of prostate: Secondary | ICD-10-CM

## 2023-01-12 DIAGNOSIS — K219 Gastro-esophageal reflux disease without esophagitis: Secondary | ICD-10-CM | POA: Diagnosis not present

## 2023-01-12 DIAGNOSIS — G8929 Other chronic pain: Secondary | ICD-10-CM | POA: Diagnosis not present

## 2023-01-12 DIAGNOSIS — F17210 Nicotine dependence, cigarettes, uncomplicated: Secondary | ICD-10-CM | POA: Insufficient documentation

## 2023-01-12 DIAGNOSIS — Z191 Hormone sensitive malignancy status: Secondary | ICD-10-CM | POA: Diagnosis not present
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
## 2023-01-13 ENCOUNTER — Telehealth: Payer: Self-pay | Admitting: *Deleted

## 2023-01-13 ENCOUNTER — Other Ambulatory Visit: Payer: Self-pay

## 2023-01-13 DIAGNOSIS — C61 Malignant neoplasm of prostate: Secondary | ICD-10-CM

## 2023-01-13 NOTE — Progress Notes (Addendum)
Spoke with patient via telephone to introduce myself as the prostate nurse navigator and discussed my role.    Patient was referred to Dr. Langston Masker in [place] for radiation, however, this is out of network.  Pt aware, and would like to proceed with referral to [facility] in [place] for daily radiation.  Pt verbalized he is hesitant to start ADT due to already feeling fatigued, and loss of libido.  RN encouraged patient to speak with urology to review pros and cons, pt agreeable.    Will coordinate for patient to see Dr. Ronne Binning to review ADT and referral placed for [facility].   Will continue to follow to ensure care coordination.

## 2023-01-13 NOTE — Telephone Encounter (Signed)
RECEIVED PHONE CALL FROM [facility] AND THIS PATIENT WILL BE SEEN BY DR. Langston Masker ON 01-20-23 @ 9:15 AM

## 2023-01-18 ENCOUNTER — Other Ambulatory Visit: Payer: Self-pay | Admitting: Urology

## 2023-01-18 DIAGNOSIS — C61 Malignant neoplasm of prostate: Secondary | ICD-10-CM

## 2023-01-18 NOTE — Progress Notes (Signed)
Patient is scheduled for consult at [facility] Rad [facility] 8/1 at 11:15. Pending ADT appointment with Dr. Ronne Binning at this time, additional request placed for appointment.  Will continue to follow.

## 2023-01-26 DIAGNOSIS — F319 Bipolar disorder, unspecified: Secondary | ICD-10-CM | POA: Diagnosis not present

## 2023-01-26 DIAGNOSIS — G4700 Insomnia, unspecified: Secondary | ICD-10-CM | POA: Diagnosis not present

## 2023-01-26 DIAGNOSIS — F1120 Opioid dependence, uncomplicated: Secondary | ICD-10-CM | POA: Diagnosis not present

## 2023-01-26 DIAGNOSIS — F4320 Adjustment disorder, unspecified: Secondary | ICD-10-CM | POA: Diagnosis not present

## 2023-01-26 DIAGNOSIS — F419 Anxiety disorder, unspecified: Secondary | ICD-10-CM | POA: Diagnosis not present

## 2023-01-31 ENCOUNTER — Telehealth: Payer: Self-pay

## 2023-01-31 NOTE — Telephone Encounter (Signed)
Patient states that the doctor in [place] states he did not need the markers and spaceroar procedure, he has had the same results without having this procedure.  Patient states he is also scheduled for another PET scan in [place] by same MD.  He agreed to the procedure with you on 09/05 but is asking if he needs this procedure?  Will patient need to decide if he is continuing care with our office or in [place]?

## 2023-02-01 NOTE — Progress Notes (Signed)
RN received confirmation from urology office that request for ADT has been received and will be review when Dr. Ronne Binning is back in office next week.

## 2023-02-02 ENCOUNTER — Other Ambulatory Visit: Payer: Self-pay

## 2023-02-02 DIAGNOSIS — C61 Malignant neoplasm of prostate: Secondary | ICD-10-CM

## 2023-02-02 MED ORDER — FLEET ENEMA RE ENEM: 1.0000 | ENEMA | Freq: Once | RECTAL | 0 refills | Status: AC

## 2023-02-10 DIAGNOSIS — F1120 Opioid dependence, uncomplicated: Secondary | ICD-10-CM | POA: Diagnosis not present

## 2023-02-10 DIAGNOSIS — F319 Bipolar disorder, unspecified: Secondary | ICD-10-CM | POA: Diagnosis not present

## 2023-02-10 DIAGNOSIS — F419 Anxiety disorder, unspecified: Secondary | ICD-10-CM | POA: Diagnosis not present

## 2023-02-14 NOTE — Telephone Encounter (Signed)
Left vm requesting call back. 

## 2023-02-16 NOTE — Progress Notes (Signed)
RN left message for call back to follow up with treatment decision and ensure he is receiving radiation treatment in [place].  Patient has not been scheduled for ADT with Dr. Ronne Binning, staff have tried contacting patient with no success.  RN will update patient upon call back.

## 2023-02-18 ENCOUNTER — Telehealth: Payer: Self-pay

## 2023-02-18 NOTE — Telephone Encounter (Signed)
-----   Message from Wilkie Aye sent at 02/10/2023 10:34 AM EDT ----- Tonie Griffith ----- Message ----- From: Gustavus Messing, LPN Sent: 4/0/9811   3:28 PM EDT To: Malen Gauze, MD  What ADT will patient be starting? ----- Message ----- From: Karlton Lemon Sent: 01/19/2023   2:23 PM EDT To: Gustavus Messing, LPN; Troy Sine, CMA; #  Dr. Ronne Binning would like patient to start ADT therapy. I wanted to forward the message to ensure authorization is obtained if required. I believe the CPT code is [phone].  Thank you!

## 2023-02-18 NOTE — Telephone Encounter (Signed)
Note scanned into media. Patient does not wish to proceed with surgery

## 2023-02-18 NOTE — Telephone Encounter (Signed)
Patient called with no answer. Message left to return call to office. 

## 2023-02-22 ENCOUNTER — Ambulatory Visit: Payer: BC Managed Care – PPO | Admitting: Urology

## 2023-02-22 ENCOUNTER — Encounter: Payer: Self-pay | Admitting: Urology

## 2023-02-22 VITALS — BP 114/78 | HR 76 | Temp 98.2°F

## 2023-02-22 DIAGNOSIS — N5089 Other specified disorders of the male genital organs: Secondary | ICD-10-CM | POA: Diagnosis not present

## 2023-02-22 DIAGNOSIS — N452 Orchitis: Secondary | ICD-10-CM

## 2023-02-22 DIAGNOSIS — C61 Malignant neoplasm of prostate: Secondary | ICD-10-CM

## 2023-02-22 DIAGNOSIS — N5082 Scrotal pain: Secondary | ICD-10-CM

## 2023-02-22 MED ORDER — SULFAMETHOXAZOLE-TRIMETHOPRIM 800-160 MG PO TABS
1.0000 | ORAL_TABLET | Freq: Two times a day (BID) | ORAL | 0 refills | Status: DC
Start: 2023-02-22 — End: 2023-10-03
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
## 2023-02-23 DIAGNOSIS — F1120 Opioid dependence, uncomplicated: Secondary | ICD-10-CM | POA: Diagnosis not present

## 2023-02-23 DIAGNOSIS — F4320 Adjustment disorder, unspecified: Secondary | ICD-10-CM | POA: Diagnosis not present

## 2023-02-23 DIAGNOSIS — F410 Panic disorder [episodic paroxysmal anxiety] without agoraphobia: Secondary | ICD-10-CM | POA: Diagnosis not present

## 2023-02-23 DIAGNOSIS — F419 Anxiety disorder, unspecified: Secondary | ICD-10-CM | POA: Diagnosis not present

## 2023-02-23 LAB — URINALYSIS, ROUTINE W REFLEX MICROSCOPIC
Bilirubin, UA: NEGATIVE
Glucose, UA: NEGATIVE
Ketones, UA: NEGATIVE
Leukocytes,UA: NEGATIVE
Nitrite, UA: NEGATIVE
Protein,UA: NEGATIVE
Specific Gravity, UA: 1.02 (ref 1.005–1.030)
Urobilinogen, Ur: 0.2 mg/dL (ref 0.2–1.0)
pH, UA: 6.5 (ref 5.0–7.5)

## 2023-02-23 LAB — MICROSCOPIC EXAMINATION
Bacteria, UA: NONE SEEN
WBC, UA: NONE SEEN /HPF (ref 0–5)

## 2023-02-24 ENCOUNTER — Ambulatory Visit (HOSPITAL_COMMUNITY): Admission: RE | Admit: 2023-02-24 | Payer: BC Managed Care – PPO | Source: Ambulatory Visit

## 2023-02-24 ENCOUNTER — Ambulatory Visit: Admit: 2023-02-24 | Payer: BC Managed Care – PPO | Admitting: Urology

## 2023-02-24 SURGERY — GOLD SEED IMPLANT
Anesthesia: General

## 2023-02-24 NOTE — Progress Notes (Signed)
Pt started Orgovyx under the care of Dr. Ronne Binning and is seeing radiation oncology with [facility].

## 2023-03-02 ENCOUNTER — Ambulatory Visit: Payer: BC Managed Care – PPO | Admitting: Urology

## 2023-03-04 DIAGNOSIS — F4320 Adjustment disorder, unspecified: Secondary | ICD-10-CM | POA: Diagnosis not present

## 2023-03-04 DIAGNOSIS — F1120 Opioid dependence, uncomplicated: Secondary | ICD-10-CM | POA: Diagnosis not present

## 2023-03-04 DIAGNOSIS — F419 Anxiety disorder, unspecified: Secondary | ICD-10-CM | POA: Diagnosis not present

## 2023-03-04 DIAGNOSIS — F319 Bipolar disorder, unspecified: Secondary | ICD-10-CM | POA: Diagnosis not present

## 2023-03-14 ENCOUNTER — Ambulatory Visit: Payer: BC Managed Care – PPO | Admitting: Urology

## 2023-03-29 DIAGNOSIS — F4320 Adjustment disorder, unspecified: Secondary | ICD-10-CM | POA: Diagnosis not present

## 2023-03-29 DIAGNOSIS — F419 Anxiety disorder, unspecified: Secondary | ICD-10-CM | POA: Diagnosis not present

## 2023-03-29 DIAGNOSIS — F1120 Opioid dependence, uncomplicated: Secondary | ICD-10-CM | POA: Diagnosis not present

## 2023-03-29 DIAGNOSIS — G4700 Insomnia, unspecified: Secondary | ICD-10-CM | POA: Diagnosis not present

## 2023-04-01 ENCOUNTER — Other Ambulatory Visit: Payer: Self-pay | Admitting: Family Medicine

## 2023-04-01 DIAGNOSIS — K219 Gastro-esophageal reflux disease without esophagitis: Secondary | ICD-10-CM

## 2023-04-04 DIAGNOSIS — F1120 Opioid dependence, uncomplicated: Secondary | ICD-10-CM | POA: Diagnosis not present

## 2023-04-04 DIAGNOSIS — F4320 Adjustment disorder, unspecified: Secondary | ICD-10-CM | POA: Diagnosis not present

## 2023-04-04 DIAGNOSIS — F319 Bipolar disorder, unspecified: Secondary | ICD-10-CM | POA: Diagnosis not present

## 2023-04-04 DIAGNOSIS — F419 Anxiety disorder, unspecified: Secondary | ICD-10-CM | POA: Diagnosis not present

## 2023-04-12 ENCOUNTER — Ambulatory Visit: Payer: BC Managed Care – PPO | Admitting: Urology

## 2023-04-12 DIAGNOSIS — N452 Orchitis: Secondary | ICD-10-CM

## 2023-04-12 DIAGNOSIS — C61 Malignant neoplasm of prostate: Secondary | ICD-10-CM

## 2023-04-21 ENCOUNTER — Telehealth: Payer: Self-pay | Admitting: Family Medicine

## 2023-04-22 ENCOUNTER — Encounter: Payer: Self-pay | Admitting: Family Medicine

## 2023-04-22 ENCOUNTER — Telehealth: Payer: Self-pay | Admitting: Family Medicine

## 2023-04-22 DIAGNOSIS — Z0279 Encounter for issue of other medical certificate: Secondary | ICD-10-CM

## 2023-04-22 NOTE — Telephone Encounter (Signed)
I have not. They would have gone to [facility]

## 2023-04-22 NOTE — Telephone Encounter (Signed)
ROI form placed with FMLA paperwork

## 2023-04-22 NOTE — Telephone Encounter (Signed)
Wife-Francisco [name] dropped off FMLA forms to be completed and signed.  Form Fee Paid? (Y/N)   yes         If NO, form is placed on front office manager desk to hold until payment received. If YES, then form will be placed in the RX/HH Nurse Coordinators box for completion.  Form will not be processed until payment is received

## 2023-04-22 NOTE — Telephone Encounter (Signed)
PCP completed and signed FMLA forms. Patient has been contacted and informed they are complete and ready for pickup

## 2023-04-22 NOTE — Telephone Encounter (Signed)
Left vm for cb to see when paper work is due   Have you got any updated forms ?

## 2023-04-22 NOTE — Telephone Encounter (Signed)
Ppw updated and placed on PCP's desk

## 2023-04-22 NOTE — Telephone Encounter (Signed)
Returned. Seeing [facility] for radiation. Prob need ROI to get their notes if needing duration

## 2023-05-02 DIAGNOSIS — R972 Elevated prostate specific antigen [PSA]: Secondary | ICD-10-CM | POA: Diagnosis not present

## 2023-05-02 DIAGNOSIS — F4320 Adjustment disorder, unspecified: Secondary | ICD-10-CM | POA: Diagnosis not present

## 2023-05-02 DIAGNOSIS — F1120 Opioid dependence, uncomplicated: Secondary | ICD-10-CM | POA: Diagnosis not present

## 2023-05-02 DIAGNOSIS — F410 Panic disorder [episodic paroxysmal anxiety] without agoraphobia: Secondary | ICD-10-CM | POA: Diagnosis not present

## 2023-05-06 DIAGNOSIS — Z20822 Contact with and (suspected) exposure to covid-19: Secondary | ICD-10-CM | POA: Diagnosis not present

## 2023-05-06 DIAGNOSIS — A084 Viral intestinal infection, unspecified: Secondary | ICD-10-CM | POA: Diagnosis not present

## 2023-05-06 DIAGNOSIS — R112 Nausea with vomiting, unspecified: Secondary | ICD-10-CM | POA: Diagnosis not present

## 2023-05-07 DIAGNOSIS — Z8546 Personal history of malignant neoplasm of prostate: Secondary | ICD-10-CM | POA: Diagnosis not present

## 2023-05-07 DIAGNOSIS — R197 Diarrhea, unspecified: Secondary | ICD-10-CM | POA: Diagnosis not present

## 2023-05-07 DIAGNOSIS — F17210 Nicotine dependence, cigarettes, uncomplicated: Secondary | ICD-10-CM | POA: Diagnosis not present

## 2023-05-07 DIAGNOSIS — E860 Dehydration: Secondary | ICD-10-CM | POA: Diagnosis not present

## 2023-05-07 DIAGNOSIS — R109 Unspecified abdominal pain: Secondary | ICD-10-CM | POA: Diagnosis not present

## 2023-05-07 DIAGNOSIS — R1030 Lower abdominal pain, unspecified: Secondary | ICD-10-CM | POA: Diagnosis not present

## 2023-05-07 DIAGNOSIS — R1084 Generalized abdominal pain: Secondary | ICD-10-CM | POA: Diagnosis not present

## 2023-05-07 DIAGNOSIS — R112 Nausea with vomiting, unspecified: Secondary | ICD-10-CM | POA: Diagnosis not present

## 2023-05-07 DIAGNOSIS — C61 Malignant neoplasm of prostate: Secondary | ICD-10-CM | POA: Diagnosis not present

## 2023-05-09 DIAGNOSIS — Z8546 Personal history of malignant neoplasm of prostate: Secondary | ICD-10-CM | POA: Diagnosis not present

## 2023-05-09 DIAGNOSIS — R0602 Shortness of breath: Secondary | ICD-10-CM | POA: Diagnosis not present

## 2023-05-09 DIAGNOSIS — R112 Nausea with vomiting, unspecified: Secondary | ICD-10-CM | POA: Diagnosis not present

## 2023-05-09 DIAGNOSIS — Z792 Long term (current) use of antibiotics: Secondary | ICD-10-CM | POA: Diagnosis not present

## 2023-05-09 DIAGNOSIS — R079 Chest pain, unspecified: Secondary | ICD-10-CM | POA: Diagnosis not present

## 2023-05-09 DIAGNOSIS — R1110 Vomiting, unspecified: Secondary | ICD-10-CM | POA: Diagnosis not present

## 2023-05-09 DIAGNOSIS — K529 Noninfective gastroenteritis and colitis, unspecified: Secondary | ICD-10-CM | POA: Diagnosis not present

## 2023-05-10 DIAGNOSIS — Z8546 Personal history of malignant neoplasm of prostate: Secondary | ICD-10-CM | POA: Diagnosis not present

## 2023-05-10 DIAGNOSIS — Z79899 Other long term (current) drug therapy: Secondary | ICD-10-CM | POA: Diagnosis not present

## 2023-05-10 DIAGNOSIS — K529 Noninfective gastroenteritis and colitis, unspecified: Secondary | ICD-10-CM | POA: Diagnosis not present

## 2023-05-12 ENCOUNTER — Encounter: Payer: Self-pay | Admitting: Nurse Practitioner

## 2023-05-12 ENCOUNTER — Ambulatory Visit: Payer: BC Managed Care – PPO | Admitting: Nurse Practitioner

## 2023-05-12 ENCOUNTER — Emergency Department (HOSPITAL_COMMUNITY)
Admission: EM | Admit: 2023-05-12 | Discharge: 2023-05-12 | Disposition: A | Discharge status: Home / Self Care | Payer: BC Managed Care – PPO | Attending: Emergency Medicine | Admitting: Emergency Medicine

## 2023-05-12 ENCOUNTER — Other Ambulatory Visit: Payer: Self-pay

## 2023-05-12 ENCOUNTER — Encounter (HOSPITAL_COMMUNITY): Payer: Self-pay

## 2023-05-12 VITALS — BP 116/80 | HR 72 | Temp 97.2°F | Ht 69.0 in | Wt 240.2 lb

## 2023-05-12 DIAGNOSIS — R7989 Other specified abnormal findings of blood chemistry: Secondary | ICD-10-CM | POA: Insufficient documentation

## 2023-05-12 DIAGNOSIS — R109 Unspecified abdominal pain: Secondary | ICD-10-CM | POA: Diagnosis not present

## 2023-05-12 DIAGNOSIS — K529 Noninfective gastroenteritis and colitis, unspecified: Secondary | ICD-10-CM

## 2023-05-12 DIAGNOSIS — R1084 Generalized abdominal pain: Secondary | ICD-10-CM

## 2023-05-12 DIAGNOSIS — Z09 Encounter for follow-up examination after completed treatment for conditions other than malignant neoplasm: Secondary | ICD-10-CM | POA: Insufficient documentation

## 2023-05-12 HISTORY — DX: Noninfective gastroenteritis and colitis, unspecified: K52.9

## 2023-05-12 LAB — URINALYSIS, ROUTINE W REFLEX MICROSCOPIC
Bilirubin Urine: NEGATIVE
Glucose, UA: NEGATIVE mg/dL
Hgb urine dipstick: NEGATIVE
Ketones, ur: NEGATIVE mg/dL
Leukocytes,Ua: NEGATIVE
Nitrite: NEGATIVE
Protein, ur: NEGATIVE mg/dL
Specific Gravity, Urine: 1.019 (ref 1.005–1.030)
pH: 7 (ref 5.0–8.0)

## 2023-05-12 LAB — LIPASE, BLOOD: Lipase: 23 U/L (ref 11–51)

## 2023-05-12 LAB — COMPREHENSIVE METABOLIC PANEL
ALT: 67 U/L — ABNORMAL HIGH (ref 0–44)
AST: 67 U/L — ABNORMAL HIGH (ref 15–41)
Albumin: 3.4 g/dL — ABNORMAL LOW (ref 3.5–5.0)
Alkaline Phosphatase: 95 U/L (ref 38–126)
Anion gap: 9 (ref 5–15)
BUN: 7 mg/dL (ref 6–20)
CO2: 25 mmol/L (ref 22–32)
Calcium: 8.7 mg/dL — ABNORMAL LOW (ref 8.9–10.3)
Chloride: 103 mmol/L (ref 98–111)
Creatinine, Ser: 0.95 mg/dL (ref 0.61–1.24)
GFR, Estimated: >60 mL/min (ref >60)
Glucose, Bld: 126 mg/dL — ABNORMAL HIGH (ref 70–99)
Potassium: 4.2 mmol/L (ref 3.5–5.1)
Sodium: 137 mmol/L (ref 135–145)
Total Bilirubin: 0.9 mg/dL (ref <1.2)
Total Protein: 5.7 g/dL — ABNORMAL LOW (ref 6.5–8.1)

## 2023-05-12 LAB — CBC
HCT: 37.6 % — ABNORMAL LOW (ref 39.0–52.0)
Hemoglobin: 12.4 g/dL — ABNORMAL LOW (ref 13.0–17.0)
MCH: 30.3 pg (ref 26.0–34.0)
MCHC: 33 g/dL (ref 30.0–36.0)
MCV: 91.9 fL (ref 80.0–100.0)
Platelets: 312 10*3/uL (ref 150–400)
RBC: 4.09 MIL/uL — ABNORMAL LOW (ref 4.22–5.81)
RDW: 14.7 % (ref 11.5–15.5)
WBC: 9.9 10*3/uL (ref 4.0–10.5)
nRBC: 0 % (ref 0.0–0.2)

## 2023-05-12 LAB — MAGNESIUM: Magnesium: 1.7 mg/dL (ref 1.7–2.4)

## 2023-05-12 MED ORDER — SODIUM CHLORIDE 0.9 % IV BOLUS
1000.0000 mL | Freq: Once | INTRAVENOUS | Status: AC
  Administered 2023-05-12: 1000 mL via INTRAVENOUS

## 2023-05-12 NOTE — ED Provider Notes (Signed)
[facility] AT [facility] Provider Note   CSN: [id number] Arrival date & time: 05/12/23  1103     History  Chief Complaint  Patient presents with   Abdominal Pain    Francisco [name] is a 52 y.o. male.  52 yo M with a cc of diarrhea.  Patient was diagnosed with colitis and was admitted to the hospital.  Patient said he has been feeling quite a bit better and has been able to eat and drink and is eating solid foods now.  He was diagnosed earlier in the week and was discharged a couple days ago.  He reportedly had a low potassium level while he was in the hospital and he had this rechecked at his doctor's office and was told it was out of whack and he needed to come to the hospital to be readmitted.   Abdominal Pain      Home Medications Prior to Admission medications   Medication Sig Start Date End Date Taking? Authorizing Provider  albuterol (VENTOLIN HFA) 108 (90 Base) MCG/ACT inhaler Inhale 2 puffs into the lungs every 6 (six) hours as needed for wheezing or shortness of breath. 08/10/22   Raliegh Ip, DO  azithromycin (ZITHROMAX) 500 MG tablet Take by mouth. 05/10/23 05/13/23  [provider]  escitalopram (LEXAPRO) 10 MG tablet Take 10 mg by mouth daily.    [provider]  famotidine (PEPCID) 20 MG tablet Take by mouth. 05/07/23 05/22/23  [provider]  hydrOXYzine (ATARAX) 50 MG tablet Take 50 mg by mouth 2 (two) times daily as needed. 10/20/22   [provider]  ondansetron (ZOFRAN-ODT) 4 MG disintegrating tablet Dissolve 1 tablet (4 mg total) in the mouth every eight (8) hours as needed for nausea for up to 10 days. 05/10/23 05/20/23  [provider]  pantoprazole (PROTONIX) 40 MG tablet TAKE 1 TABLET BY MOUTH EVERY DAY 04/01/23   Delynn Flavin M, DO  relugolix (ORGOVYX) 120 MG tablet Take 120 mg by mouth daily.    [provider]  sulfamethoxazole-trimethoprim (BACTRIM DS)  800-160 MG tablet Take 1 tablet by mouth every 12 (twelve) hours. 02/22/23   Donnita Falls, FNP  tadalafil (CIALIS) 20 MG tablet Take 1 tablet (20 mg total) by mouth daily as needed. 11/17/22   McKenzie, Mardene Celeste, MD      Allergies    Ciprofloxacin and Phenergan [promethazine hcl]    Review of Systems   Review of Systems  Gastrointestinal:  Positive for abdominal pain.    Physical Exam Updated Vital Signs BP 125/76   Pulse 70   Temp (!) 97.4 F (36.3 C)   Resp 18   Ht 5\' 9"  (1.753 m)   Wt 108.9 kg   SpO2 95%   BMI 35.44 kg/m  Physical Exam Vitals and nursing note reviewed.  Constitutional:      Appearance: He is well-developed.  HENT:     Head: Normocephalic and atraumatic.  Eyes:     Pupils: Pupils are equal, round, and reactive to light.  Neck:     Vascular: No JVD.  Cardiovascular:     Rate and Rhythm: Normal rate and regular rhythm.     Heart sounds: No murmur heard.    No friction rub. No gallop.  Pulmonary:     Effort: No respiratory distress.     Breath sounds: No wheezing.  Abdominal:     General: There is no distension.     Tenderness:  There is no abdominal tenderness. There is no guarding or rebound.  Musculoskeletal:        General: Normal range of motion.     Cervical back: Normal range of motion and neck supple.  Skin:    Coloration: Skin is not pale.     Findings: No rash.  Neurological:     Mental Status: He is alert and oriented to person, place, and time.  Psychiatric:        Behavior: Behavior normal.     ED Results / Procedures / Treatments   Labs (all labs ordered are listed, but only abnormal results are displayed) Labs Reviewed  COMPREHENSIVE METABOLIC PANEL - Abnormal; Notable for the following components:      Result Value   Glucose, Bld 126 (*)    Calcium 8.7 (*)    Total Protein 5.7 (*)    Albumin 3.4 (*)    AST 67 (*)    ALT 67 (*)    All other components within normal limits  CBC - Abnormal; Notable for the following  components:   RBC 4.09 (*)    Hemoglobin 12.4 (*)    HCT 37.6 (*)    All other components within normal limits  LIPASE, BLOOD  URINALYSIS, ROUTINE W REFLEX MICROSCOPIC  MAGNESIUM    EKG None  Radiology No results found.  Procedures Procedures    Medications Ordered in ED Medications  sodium chloride 0.9 % bolus 1,000 mL (0 mLs Intravenous Stopped 05/12/23 1334)    ED Course/ Medical Decision Making/ A&P                                 Medical Decision Making Amount and/or Complexity of Data Reviewed Labs: ordered.   52 yo M with a chief complaints of care rehab.  The patient had recently been diagnosed with colitis and was admitted to an outside hospital.  He was discharged about 48 hours ago and had repeat lab work done by his family doctor and they were concerned about some lab abnormalities and requested that he come to the emergency department for evaluation.  He actually feels like he has been doing quite a bit better.  Is tolerating solid foods now.  Will recheck blood work here.  Bolus of IV fluids.  Patient continues to feel well.  UA negative for infection, no hypokalemia no hypomagnesemia.  No significant electrolyte abnormalities.  LFTs are very trivially elevated.  Lipase is normal.  Patient has anemia but at 12.4.  I discussed results with the patient.  He would like to go home at this time.  Will follow-up with his family doctor in the office.  2:29 PM:  I have discussed the diagnosis/risks/treatment options with the patient.  Evaluation and diagnostic testing in the emergency department does not suggest an emergent condition requiring admission or immediate intervention beyond what has been performed at this time.  They will follow up with PCP. We also discussed returning to the ED immediately if new or worsening sx occur. We discussed the sx which are most concerning (e.g., sudden worsening pain, fever, inability to tolerate by mouth) that necessitate  immediate return. Medications administered to the patient during their visit and any new prescriptions provided to the patient are listed below.  Medications given during this visit Medications  sodium chloride 0.9 % bolus 1,000 mL (0 mLs Intravenous Stopped 05/12/23 1334)     The patient appears  reasonably screen and/or stabilized for discharge and I doubt any other medical condition or other [facility] requiring further screening, evaluation, or treatment in the ED at this time prior to discharge.          Final Clinical Impression(s) / ED Diagnoses Final diagnoses:  Generalized abdominal pain    Rx / DC Orders ED Discharge Orders     None         Melene Plan, DO 05/12/23 1429

## 2023-05-12 NOTE — ED Triage Notes (Signed)
Pt to ED via pov from doctor's office. Pt states he was feeling sick for past 8 days. Pt has has had abdominal pain, vomiting and diarrhea. Pt was diagnosed with colitis and hospitalized at [facility] for same. Pt went for follow up visit today and was found to have low K, low RBC, and low hematocrit (unsure of values) in bloodwork that was drawn 2 days ago. Pt sent to ED for further workup. Pt states he is still having pain and diarrhea. Family state stool looks like it has bugs in it.

## 2023-05-12 NOTE — Discharge Instructions (Signed)
Follow up with your family doc in the office.  Return for worsening symptoms.
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
## 2023-05-13 ENCOUNTER — Telehealth: Payer: Self-pay | Admitting: Family Medicine

## 2023-05-13 DIAGNOSIS — Z0279 Encounter for issue of other medical certificate: Secondary | ICD-10-CM

## 2023-05-13 NOTE — Telephone Encounter (Signed)
Short Term Disability form was dropped off and paid for by wife. She is aware that we fill fax form once completed and email her a copy with email on pts file.

## 2023-05-23 ENCOUNTER — Encounter: Payer: Self-pay | Admitting: Family Medicine

## 2023-05-23 DIAGNOSIS — F1120 Opioid dependence, uncomplicated: Secondary | ICD-10-CM | POA: Diagnosis not present

## 2023-05-23 DIAGNOSIS — F1210 Cannabis abuse, uncomplicated: Secondary | ICD-10-CM | POA: Diagnosis not present

## 2023-05-23 NOTE — Telephone Encounter (Signed)
PCP completed and signed STD forms. They have been faxed to [employer] at fax number [phone]. Patient has been contacted and informed they are complete.

## 2023-06-02 ENCOUNTER — Telehealth: Payer: Self-pay | Admitting: Family Medicine

## 2023-06-02 DIAGNOSIS — F319 Bipolar disorder, unspecified: Secondary | ICD-10-CM | POA: Diagnosis not present

## 2023-06-02 DIAGNOSIS — F4320 Adjustment disorder, unspecified: Secondary | ICD-10-CM | POA: Diagnosis not present

## 2023-06-02 DIAGNOSIS — Z0279 Encounter for issue of other medical certificate: Secondary | ICD-10-CM

## 2023-06-02 DIAGNOSIS — F419 Anxiety disorder, unspecified: Secondary | ICD-10-CM | POA: Diagnosis not present

## 2023-06-02 DIAGNOSIS — G4700 Insomnia, unspecified: Secondary | ICD-10-CM | POA: Diagnosis not present

## 2023-06-06 NOTE — Telephone Encounter (Signed)
Called to let wife know that we received the incorrect FMLA paperwork.  She is going to email it to me and I will forward it to RX/HH coordinator.

## 2023-06-08 NOTE — Telephone Encounter (Signed)
PCP completed and signed leave forms. They have been faxed to [employer] at fax number [phone]. Patient has been contacted and informed they are complete.

## 2023-06-27 DIAGNOSIS — F1220 Cannabis dependence, uncomplicated: Secondary | ICD-10-CM | POA: Diagnosis not present

## 2023-06-27 DIAGNOSIS — F1120 Opioid dependence, uncomplicated: Secondary | ICD-10-CM | POA: Diagnosis not present

## 2023-06-27 DIAGNOSIS — F17200 Nicotine dependence, unspecified, uncomplicated: Secondary | ICD-10-CM | POA: Diagnosis not present

## 2023-06-27 DIAGNOSIS — F419 Anxiety disorder, unspecified: Secondary | ICD-10-CM | POA: Diagnosis not present

## 2023-07-11 DIAGNOSIS — F1210 Cannabis abuse, uncomplicated: Secondary | ICD-10-CM | POA: Diagnosis not present

## 2023-07-11 DIAGNOSIS — F319 Bipolar disorder, unspecified: Secondary | ICD-10-CM | POA: Diagnosis not present

## 2023-07-11 DIAGNOSIS — F4320 Adjustment disorder, unspecified: Secondary | ICD-10-CM | POA: Diagnosis not present

## 2023-07-11 DIAGNOSIS — F1120 Opioid dependence, uncomplicated: Secondary | ICD-10-CM | POA: Diagnosis not present

## 2023-08-01 DIAGNOSIS — F17200 Nicotine dependence, unspecified, uncomplicated: Secondary | ICD-10-CM | POA: Diagnosis not present

## 2023-08-01 DIAGNOSIS — F1120 Opioid dependence, uncomplicated: Secondary | ICD-10-CM | POA: Diagnosis not present

## 2023-08-01 DIAGNOSIS — F419 Anxiety disorder, unspecified: Secondary | ICD-10-CM | POA: Diagnosis not present

## 2023-08-10 DIAGNOSIS — F319 Bipolar disorder, unspecified: Secondary | ICD-10-CM | POA: Diagnosis not present

## 2023-08-10 DIAGNOSIS — G4700 Insomnia, unspecified: Secondary | ICD-10-CM | POA: Diagnosis not present

## 2023-08-10 DIAGNOSIS — F4320 Adjustment disorder, unspecified: Secondary | ICD-10-CM | POA: Diagnosis not present

## 2023-08-10 DIAGNOSIS — F419 Anxiety disorder, unspecified: Secondary | ICD-10-CM | POA: Diagnosis not present

## 2023-09-07 DIAGNOSIS — F419 Anxiety disorder, unspecified: Secondary | ICD-10-CM | POA: Diagnosis not present

## 2023-09-07 DIAGNOSIS — F17200 Nicotine dependence, unspecified, uncomplicated: Secondary | ICD-10-CM | POA: Diagnosis not present

## 2023-09-07 DIAGNOSIS — G4700 Insomnia, unspecified: Secondary | ICD-10-CM | POA: Diagnosis not present

## 2023-09-07 DIAGNOSIS — F4320 Adjustment disorder, unspecified: Secondary | ICD-10-CM | POA: Diagnosis not present

## 2023-09-12 DIAGNOSIS — F410 Panic disorder [episodic paroxysmal anxiety] without agoraphobia: Secondary | ICD-10-CM | POA: Diagnosis not present

## 2023-09-12 DIAGNOSIS — F419 Anxiety disorder, unspecified: Secondary | ICD-10-CM | POA: Diagnosis not present

## 2023-09-12 DIAGNOSIS — F1220 Cannabis dependence, uncomplicated: Secondary | ICD-10-CM | POA: Diagnosis not present

## 2023-09-12 DIAGNOSIS — F1120 Opioid dependence, uncomplicated: Secondary | ICD-10-CM | POA: Diagnosis not present

## 2023-09-21 ENCOUNTER — Other Ambulatory Visit: Payer: Self-pay | Admitting: Family Medicine

## 2023-09-21 ENCOUNTER — Telehealth: Payer: Self-pay | Admitting: Family Medicine

## 2023-09-21 DIAGNOSIS — F17200 Nicotine dependence, unspecified, uncomplicated: Secondary | ICD-10-CM

## 2023-09-21 MED ORDER — VARENICLINE TARTRATE 1 MG PO TABS: 1.0000 mg | ORAL_TABLET | Freq: Two times a day (BID) | ORAL | 1 refills | Status: DC

## 2023-09-21 MED ORDER — VARENICLINE TARTRATE (STARTER) 0.5 MG X 11 & 1 MG X 42 PO TBPK: ORAL_TABLET | ORAL | 0 refills | Status: DC

## 2023-09-21 NOTE — Telephone Encounter (Unsigned)
 Copied from CRM [id number]. Topic: Clinical - Medication Question >> Sep 21, 2023  9:44 AM Higinio Roger wrote: Reason for CRM: Patient is requesting Varenicline (Chantix).  Callback # [id number]  Preferred Pharmacy: [id number] - [place] [place] - [address] [facility] [address] [place] [address] Phone: [phone] Fax: [phone] Hours: Not open 24 hours

## 2023-09-21 NOTE — Telephone Encounter (Signed)
Please advise, not on current med list.  

## 2023-09-21 NOTE — Telephone Encounter (Signed)
 done

## 2023-09-22 NOTE — Telephone Encounter (Signed)
Pt aware medication  sent to pharmacy 

## 2023-09-26 ENCOUNTER — Telehealth: Payer: Self-pay | Admitting: Family Medicine

## 2023-09-26 DIAGNOSIS — Z0279 Encounter for issue of other medical certificate: Secondary | ICD-10-CM

## 2023-09-26 NOTE — Telephone Encounter (Signed)
Pt dropped off FMLA forms to be completed and signed.  Form Fee Paid?YES            If NO, form is placed on front office manager desk to hold until payment received. If YES, then form will be placed in the RX/HH Nurse Coordinators box for completion.  Form will not be processed until payment is received

## 2023-10-03 ENCOUNTER — Ambulatory Visit (INDEPENDENT_AMBULATORY_CARE_PROVIDER_SITE_OTHER): Admitting: Family Medicine

## 2023-10-03 ENCOUNTER — Encounter: Payer: Self-pay | Admitting: Family Medicine

## 2023-10-03 VITALS — BP 118/86 | HR 67 | Ht 69.0 in | Wt 252.0 lb

## 2023-10-03 DIAGNOSIS — Z0001 Encounter for general adult medical examination with abnormal findings: Secondary | ICD-10-CM

## 2023-10-03 DIAGNOSIS — F17200 Nicotine dependence, unspecified, uncomplicated: Secondary | ICD-10-CM | POA: Diagnosis not present

## 2023-10-03 DIAGNOSIS — C61 Malignant neoplasm of prostate: Secondary | ICD-10-CM

## 2023-10-03 DIAGNOSIS — Z1159 Encounter for screening for other viral diseases: Secondary | ICD-10-CM | POA: Diagnosis not present

## 2023-10-03 DIAGNOSIS — E7800 Pure hypercholesterolemia, unspecified: Secondary | ICD-10-CM | POA: Diagnosis not present

## 2023-10-03 DIAGNOSIS — G4730 Sleep apnea, unspecified: Secondary | ICD-10-CM | POA: Diagnosis not present

## 2023-10-03 DIAGNOSIS — L819 Disorder of pigmentation, unspecified: Secondary | ICD-10-CM

## 2023-10-03 DIAGNOSIS — D649 Anemia, unspecified: Secondary | ICD-10-CM | POA: Diagnosis not present

## 2023-10-03 DIAGNOSIS — Z0000 Encounter for general adult medical examination without abnormal findings: Secondary | ICD-10-CM

## 2023-10-03 DIAGNOSIS — Z114 Encounter for screening for human immunodeficiency virus [HIV]: Secondary | ICD-10-CM

## 2023-10-03 DIAGNOSIS — M5412 Radiculopathy, cervical region: Secondary | ICD-10-CM | POA: Diagnosis not present

## 2023-10-03 DIAGNOSIS — Z23 Encounter for immunization: Secondary | ICD-10-CM | POA: Diagnosis not present

## 2023-10-03 DIAGNOSIS — R7309 Other abnormal glucose: Secondary | ICD-10-CM | POA: Diagnosis not present

## 2023-10-03 DIAGNOSIS — R739 Hyperglycemia, unspecified: Secondary | ICD-10-CM | POA: Diagnosis not present

## 2023-10-03 LAB — LIPID PANEL

## 2023-10-03 NOTE — Patient Instructions (Signed)
 Cervical Radiculopathy  Cervical radiculopathy means that a nerve in the neck (a cervical nerve) is pinched or bruised. This can happen because of an injury to the cervical spine (vertebrae) in the neck, or as a normal part of getting older. This condition can cause pain or loss of feeling (numbness) that runs from your neck all the way down to your arm and fingers. Often, this condition gets better with rest. Treatment may be needed if the condition does not get better. What are the causes? A neck injury. A bulging disk in your spine. Sudden muscle tightening (muscle spasms). Tight muscles in your neck due to overuse. Arthritis. Breakdown in the bones and joints of the spine (spondylosis) due to getting older. Bone spurs that form near the nerves in the neck. What are the signs or symptoms? Pain. The pain may: Run from the neck to the arm and hand. Be very bad or irritating. Get worse when you move your neck. Loss of feeling or tingling in your arm or hand. Weakness in your arm or hand, in very bad cases. How is this treated? In many cases, treatment is not needed for this condition. With rest, the condition often gets better over time. If treatment is needed, options may include: Wearing a soft neck collar (cervical collar) for short periods of time. Doing exercises (physical therapy) to strengthen your neck muscles. Taking medicines. Having shots (injections) in your spine, in very bad cases. Having surgery. This may be needed if other treatments do not help. The type of surgery that is used will depend on the cause of your condition. Follow these instructions at home: If you have a soft neck collar: Wear it as told by your doctor. Take it off only as told by your doctor. Ask your doctor if you can take the collar off for cleaning and bathing. If you are allowed to take the collar off for cleaning or bathing: Follow instructions from your doctor about how to take off the collar  safely. Clean the collar by wiping it with mild soap and water and drying it completely. Take out any removable pads in the collar every 1-2 days. Wash them by hand with soap and water. Let them air-dry completely before you put them back in the collar. Check your skin under the collar for redness or sores. If you see any, tell your doctor. Managing pain     Take over-the-counter and prescription medicines only as told by your doctor. If told, put ice on the painful area. To do this: If you have a soft neck collar, take if off as told by your doctor. Put ice in a plastic bag. Place a towel between your skin and the bag. Leave the ice on for 20 minutes, 2-3 times a day. Take off the ice if your skin turns bright red. This is very important. If you cannot feel pain, heat, or cold, you have a greater risk of damage to the area. If using ice does not help, you can try using heat. Use the heat source that your doctor recommends, such as a moist heat pack or a heating pad. Place a towel between your skin and the heat source. Leave the heat on for 20-30 minutes. Take off the heat if your skin turns bright red. This is very important. If you cannot feel pain, heat, or cold, you have a greater risk of getting burned. You may try a gentle neck and shoulder rub (massage). Activity Rest as needed. Return  to your normal activities when your doctor says that it is safe. Do exercises as told by your doctor or physical therapist. You may have to avoid lifting. Ask your doctor how much you can safely lift. General instructions Use a flat pillow when you sleep. Do not drive while wearing a soft neck collar. If you do not have a soft neck collar, ask your doctor if it is safe to drive while your neck heals. Ask your doctor if you should avoid driving or using machines while you are taking your medicine. Do not smoke or use any products that contain nicotine or tobacco. If you need help quitting, ask your  doctor. Keep all follow-up visits. Contact a doctor if: Your condition does not get better with treatment. Get help right away if: Your pain gets worse and medicine does not help. You lose feeling or feel weak in your hand, arm, face, or leg. You have a high fever. Your neck is stiff. You cannot control when you poop or pee (have incontinence). You have trouble with walking, balance, or talking. Summary Cervical radiculopathy means that a nerve in the neck is pinched or bruised. A nerve can get pinched from a bulging disk, arthritis, an injury to the neck, or other causes. Symptoms include pain, tingling, or loss of feeling that goes from the neck to the arm or hand. Weakness in your arm or hand can happen in very bad cases. Treatment may include resting, wearing a soft neck collar, and doing exercises. You might need to take medicines for pain. In very bad cases, shots or surgery may be needed. This information is not intended to replace advice given to you by your health care provider. Make sure you discuss any questions you have with your health care provider. Document Revised: 12/11/2020 Document Reviewed: 12/11/2020 Elsevier Patient Education  2024 [employer].

## 2023-10-03 NOTE — Telephone Encounter (Signed)
 [employer] FMLA ppw ready for pickup
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
## 2023-10-04 ENCOUNTER — Encounter: Payer: Self-pay | Admitting: Family Medicine

## 2023-10-04 ENCOUNTER — Other Ambulatory Visit: Payer: Self-pay

## 2023-10-04 DIAGNOSIS — R739 Hyperglycemia, unspecified: Secondary | ICD-10-CM

## 2023-10-04 NOTE — Telephone Encounter (Unsigned)
 Copied from CRM [id number]. Topic: Clinical - Lab/Test Results >> Oct 04, 2023  2:29 PM Francisco [name] wrote: Reason for CRM: Patient is returning a call from [place]. Per CAL, she will call the patient back. 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 Her callback number is [phone]

## 2023-10-04 NOTE — Telephone Encounter (Signed)
 Wife has reviewed results in pts mychart. She confirms that she does not have any concerns. A1c ordered and lab add on sheet completed. Faxed Dr. Doyne Genin in [place] the results/

## 2023-10-04 NOTE — Telephone Encounter (Signed)
 I think Francisco [name] was just trying to give him his test results.

## 2023-10-05 LAB — ANEMIA PROFILE B
Basophils Absolute: 0.1 10*3/uL (ref 0.0–0.2)
Basos: 1 %
EOS (ABSOLUTE): 0.4 10*3/uL (ref 0.0–0.4)
Eos: 5 %
Ferritin: 102 ng/mL (ref 30–400)
Folate: 6.5 ng/mL (ref >3.0)
Hematocrit: 39.7 % (ref 37.5–51.0)
Hemoglobin: 13.7 g/dL (ref 13.0–17.7)
Immature Grans (Abs): 0 10*3/uL (ref 0.0–0.1)
Immature Granulocytes: 0 %
Iron Saturation: 17 % (ref 15–55)
Iron: 59 ug/dL (ref 38–169)
Lymphocytes Absolute: 1.1 10*3/uL (ref 0.7–3.1)
Lymphs: 14 %
MCH: 31.5 pg (ref 26.6–33.0)
MCHC: 34.5 g/dL (ref 31.5–35.7)
MCV: 91 fL (ref 79–97)
Monocytes Absolute: 0.6 10*3/uL (ref 0.1–0.9)
Monocytes: 8 %
Neutrophils Absolute: 5.6 10*3/uL (ref 1.4–7.0)
Neutrophils: 72 %
Platelets: 286 10*3/uL (ref 150–450)
RBC: 4.35 x10E6/uL (ref 4.14–5.80)
RDW: 13.4 % (ref 11.6–15.4)
Retic Ct Pct: 1 % (ref 0.6–2.6)
Total Iron Binding Capacity: 338 ug/dL (ref 250–450)
UIBC: 279 ug/dL (ref 111–343)
Vitamin B-12: 513 pg/mL (ref 232–1245)
WBC: 7.8 10*3/uL (ref 3.4–10.8)

## 2023-10-05 LAB — LIPID PANEL
Cholesterol, Total: 219 mg/dL — ABNORMAL HIGH (ref 100–199)
HDL: 41 mg/dL (ref >39)
LDL CALC COMMENT:: 5.3 ratio — ABNORMAL HIGH (ref 0.0–5.0)
LDL Chol Calc (NIH): 152 mg/dL — ABNORMAL HIGH (ref 0–99)
Triglycerides: 143 mg/dL (ref 0–149)
VLDL Cholesterol Cal: 26 mg/dL (ref 5–40)

## 2023-10-05 LAB — CMP14+EGFR
ALT: 26 IU/L (ref 0–44)
AST: 20 IU/L (ref 0–40)
Albumin: 4.6 g/dL (ref 4.1–5.1)
Alkaline Phosphatase: 130 IU/L — ABNORMAL HIGH (ref 44–121)
BUN/Creatinine Ratio: 17 (ref 9–20)
BUN: 12 mg/dL (ref 6–24)
CO2: 20 mmol/L (ref 20–29)
Calcium: 9.4 mg/dL (ref 8.7–10.2)
Chloride: 101 mmol/L (ref 96–106)
Creatinine, Ser: 0.72 mg/dL — ABNORMAL LOW (ref 0.76–1.27)
Globulin, Total: 2.1 g/dL (ref 1.5–4.5)
Glucose: 101 mg/dL — ABNORMAL HIGH (ref 70–99)
Potassium: 4.4 mmol/L (ref 3.5–5.2)
Sodium: 136 mmol/L (ref 134–144)
Total Protein: 6.7 g/dL (ref 6.0–8.5)
eGFR: 111 mL/min/{1.73_m2} (ref >59)

## 2023-10-05 LAB — HEPATITIS C ANTIBODY

## 2023-10-05 LAB — HIV ANTIBODY (ROUTINE TESTING W REFLEX)

## 2023-10-05 LAB — SPECIMEN STATUS REPORT

## 2023-10-05 LAB — PSA: Prostate Specific Ag, Serum: 4.7 ng/mL — ABNORMAL HIGH (ref 0.0–4.0)

## 2023-10-05 LAB — TSH: TSH: 2.07 u[IU]/mL (ref 0.450–4.500)

## 2023-10-06 LAB — HGB A1C W/O EAG: Hgb A1c MFr Bld: 5.9 % — ABNORMAL HIGH (ref 4.8–5.6)

## 2023-10-06 LAB — SPECIMEN STATUS REPORT

## 2023-10-10 DIAGNOSIS — F1120 Opioid dependence, uncomplicated: Secondary | ICD-10-CM | POA: Diagnosis not present

## 2023-10-10 DIAGNOSIS — F1210 Cannabis abuse, uncomplicated: Secondary | ICD-10-CM | POA: Diagnosis not present

## 2023-10-10 DIAGNOSIS — R972 Elevated prostate specific antigen [PSA]: Secondary | ICD-10-CM | POA: Diagnosis not present

## 2023-10-10 DIAGNOSIS — F410 Panic disorder [episodic paroxysmal anxiety] without agoraphobia: Secondary | ICD-10-CM | POA: Diagnosis not present

## 2023-10-24 DIAGNOSIS — M7912 Myalgia of auxiliary muscles, head and neck: Secondary | ICD-10-CM | POA: Diagnosis not present

## 2023-10-24 DIAGNOSIS — F1120 Opioid dependence, uncomplicated: Secondary | ICD-10-CM | POA: Diagnosis not present

## 2023-10-24 DIAGNOSIS — F319 Bipolar disorder, unspecified: Secondary | ICD-10-CM | POA: Diagnosis not present

## 2023-10-24 DIAGNOSIS — M542 Cervicalgia: Secondary | ICD-10-CM | POA: Diagnosis not present

## 2023-10-24 DIAGNOSIS — F419 Anxiety disorder, unspecified: Secondary | ICD-10-CM | POA: Diagnosis not present

## 2023-10-27 ENCOUNTER — Encounter: Admitting: Nurse Practitioner

## 2023-11-07 ENCOUNTER — Ambulatory Visit: Payer: Self-pay | Admitting: Family Medicine

## 2023-11-08 DIAGNOSIS — F319 Bipolar disorder, unspecified: Secondary | ICD-10-CM | POA: Diagnosis not present

## 2023-11-08 DIAGNOSIS — G4700 Insomnia, unspecified: Secondary | ICD-10-CM | POA: Diagnosis not present

## 2023-11-08 DIAGNOSIS — F419 Anxiety disorder, unspecified: Secondary | ICD-10-CM | POA: Diagnosis not present

## 2023-11-08 DIAGNOSIS — F4320 Adjustment disorder, unspecified: Secondary | ICD-10-CM | POA: Diagnosis not present

## 2023-11-11 ENCOUNTER — Other Ambulatory Visit: Payer: Self-pay | Admitting: Family Medicine

## 2023-11-11 DIAGNOSIS — G4733 Obstructive sleep apnea (adult) (pediatric): Secondary | ICD-10-CM

## 2023-11-11 DIAGNOSIS — G4731 Primary central sleep apnea: Secondary | ICD-10-CM

## 2023-11-21 DIAGNOSIS — F319 Bipolar disorder, unspecified: Secondary | ICD-10-CM | POA: Diagnosis not present

## 2023-11-21 DIAGNOSIS — F419 Anxiety disorder, unspecified: Secondary | ICD-10-CM | POA: Diagnosis not present

## 2023-11-21 DIAGNOSIS — F1120 Opioid dependence, uncomplicated: Secondary | ICD-10-CM | POA: Diagnosis not present

## 2023-12-06 DIAGNOSIS — F419 Anxiety disorder, unspecified: Secondary | ICD-10-CM | POA: Diagnosis not present

## 2023-12-06 DIAGNOSIS — F4320 Adjustment disorder, unspecified: Secondary | ICD-10-CM | POA: Diagnosis not present

## 2023-12-06 DIAGNOSIS — G4700 Insomnia, unspecified: Secondary | ICD-10-CM | POA: Diagnosis not present

## 2023-12-06 DIAGNOSIS — F319 Bipolar disorder, unspecified: Secondary | ICD-10-CM | POA: Diagnosis not present

## 2023-12-19 ENCOUNTER — Ambulatory Visit: Payer: Self-pay

## 2023-12-19 ENCOUNTER — Encounter: Payer: Self-pay | Admitting: Nurse Practitioner

## 2023-12-19 ENCOUNTER — Other Ambulatory Visit: Payer: Self-pay | Admitting: Nurse Practitioner

## 2023-12-19 ENCOUNTER — Ambulatory Visit: Admitting: Nurse Practitioner

## 2023-12-19 VITALS — BP 103/69 | HR 61 | Ht 69.0 in | Wt 253.0 lb

## 2023-12-19 DIAGNOSIS — K5792 Diverticulitis of intestine, part unspecified, without perforation or abscess without bleeding: Secondary | ICD-10-CM

## 2023-12-19 DIAGNOSIS — R109 Unspecified abdominal pain: Secondary | ICD-10-CM | POA: Diagnosis not present

## 2023-12-19 DIAGNOSIS — K625 Hemorrhage of anus and rectum: Secondary | ICD-10-CM

## 2023-12-19 DIAGNOSIS — G4733 Obstructive sleep apnea (adult) (pediatric): Secondary | ICD-10-CM | POA: Diagnosis not present

## 2023-12-19 MED ORDER — AMOXICILLIN 875 MG PO TABS
875.0000 mg | ORAL_TABLET | Freq: Two times a day (BID) | ORAL | 0 refills | Status: DC
Start: 2023-12-19 — End: 2024-01-03

## 2023-12-19 MED ORDER — HYDROCORT-PRAMOXINE (PERIANAL) 1-1 % EX FOAM
1.0000 | Freq: Three times a day (TID) | CUTANEOUS | 0 refills | Status: DC
Start: 2023-12-19 — End: 2024-01-03

## 2023-12-19 NOTE — Progress Notes (Signed)
 Acute Office Visit  Subjective:     Patient ID: Francisco [name], male    DOB: [birth year], 53 y.o.   MRN: [id number]  Chief Complaint  Patient presents with   Abdominal Pain   Rectal Bleeding    HPI Francisco [name] is 53 yrs o;ld male present;ld male present 12/19/2023 fro an acute visit concenrs for diverticulis flare up after eating corn 2-weeks ago Diverticulitis: Patient complains of diarrhea, diffuse abdominal pain, hematochezia, and mucus in the stool.  The pain is described as sharp, and is 6/10 in intensity. Onset was 2 weeks ago. Symptoms have been gradually worsening since. Aggravating factors: activity.  Alleviating factors: rest. Associated symptoms: diarrhea, flatus, and hematochezia. The patient denies anorexia, chills, constipation, nausea, night sweats, and vomiting.   Active Ambulatory Problems    Diagnosis Date Noted   GERD (gastroesophageal reflux disease) 03/09/2017   Tobacco use disorder 04/22/2017   Umbilical hernia without obstruction and without gangrene 04/22/2017   Neck pain of over 3 months duration 04/22/2017   Migraine without aura and without status migrainosus, not intractable 02/14/2018   Other cervical disc degeneration, unspecified cervical region 09/27/2019   IBS (irritable colon syndrome) 10/23/2019   Panic attacks 05/26/2021   Sleep apnea 05/26/2021   Malignant neoplasm of prostate (HCC) 12/21/2022   Resolved Ambulatory Problems    Diagnosis Date Noted   Rectal bleeding 03/09/2017   LLQ abdominal pain 01/23/2019   Diarrhea 01/30/2019   Acute bronchitis 10/23/2019   Hospital discharge follow-up 05/12/2023   Colitis 05/12/2023   Past Medical History:  Diagnosis Date   Chronic pain     Review of Systems  Constitutional:  Negative for chills and fever.  HENT:  Negative for congestion and sore throat.   Cardiovascular:  Negative for chest pain and leg swelling.  Gastrointestinal:  Positive for blood in stool and diarrhea. Negative for nausea.       Mucus  and blood  Skin:  Negative for itching and rash.  Neurological:  Negative for dizziness and headaches.   Negative unless indicated in HPI    Objective:    BP 103/69   Pulse 61   Ht 5' 9 (1.753 m)   Wt 253 lb (114.8 kg)   SpO2 95%   BMI 37.36 kg/m  BP Readings from Last 3 Encounters:  12/19/23 103/69  10/03/23 118/86  05/12/23 131/85   Wt Readings from Last 3 Encounters:  12/19/23 253 lb (114.8 kg)  10/03/23 252 lb (114.3 kg)  05/12/23 240 lb (108.9 kg)      Physical Exam Vitals and nursing note reviewed.  Constitutional:      General: He is not in acute distress. HENT:     Head: Normocephalic and atraumatic.     Nose: Nose normal.     Mouth/Throat:     Mouth: Mucous membranes are moist.   Eyes:     General: No scleral icterus.    Extraocular Movements: Extraocular movements intact.     Conjunctiva/sclera: Conjunctivae normal.     Pupils: Pupils are equal, round, and reactive to light.    Cardiovascular:     Heart sounds: Normal heart sounds.  Pulmonary:     Effort: Pulmonary effort is normal.     Breath sounds: Normal breath sounds.  Abdominal:     General: There is distension.     Tenderness: There is abdominal tenderness.   Musculoskeletal:     Right lower leg: No edema.     Left lower leg:  No edema.   Skin:    General: Skin is warm and dry.   Neurological:     Mental Status: He is alert and oriented to person, place, and time.   Psychiatric:        Mood and Affect: Mood normal.        Behavior: Behavior normal.        Thought Content: Thought content normal.        Judgment: Judgment normal.     No results found for any visits on 12/19/23.      Assessment & Plan:  Diverticulitis -     Amoxicillin ; Take 1 tablet (875 mg total) by mouth 2 (two) times daily.  Dispense: 20 tablet; Refill: 0  Rectal bleeding -     Amoxicillin ; Take 1 tablet (875 mg total) by mouth 2 (two) times daily.  Dispense: 20 tablet; Refill: 0  Abdominal pain in  male -     Amoxicillin ; Take 1 tablet (875 mg total) by mouth 2 (two) times daily.  Dispense: 20 tablet; Refill: 0    Francisco [name] is a 53 year old Caucasian male seen today for diverticulitis flareup, no acute distress Amoxicillin  875 twice daily for 10 days, liquid diet Avoid food that may exacerbate diverticulitis The above assessment and management plan was discussed with the patient. The patient verbalized understanding of and has agreed to the management plan. Patient is aware to call the clinic if they develop any new symptoms or if symptoms persist or worsen. Patient is aware when to return to the clinic for a follow-up visit. Patient educated on when it is appropriate to go to the emergency department.  Return if symptoms worsen or fail to improve.  Manson Luck[place]oo [place] Thompson, DNP [facility] [address] [place], [place] [address] [phone]  Note: This document was prepared by [employer] voice dictation technology and any errors that results from this process are unintentional.

## 2023-12-19 NOTE — Telephone Encounter (Signed)
 Wife made aware of all. She has no further concerns.

## 2023-12-19 NOTE — Telephone Encounter (Signed)
 Copied from CRM [id number]. Topic: Clinical - Prescription Issue >> Dec 19, 2023  2:38 PM Sasha H wrote: Reason for CRM: Pts wife states two medications were not called in, Flagyl  and Proctoform

## 2023-12-19 NOTE — Telephone Encounter (Signed)
 FYI Only or Action Required?: FYI only for provider.  Patient was last seen in primary care on 10/03/2023 by Jolinda Norene HERO, DO. Called Nurse Triage reporting Abdominal Pain. Symptoms began several weeks ago. Interventions attempted: Rest, hydration, or home remedies. Symptoms are: unchanged.  Triage Disposition: See HCP Within 4 Hours (Or PCP Triage)  Patient/caregiver understands and will follow disposition?: Yes, will follow disposition  Copied from CRM [id number]. Topic: Clinical - Red Word Triage >> Dec 19, 2023  8:08 AM Carrielelia G wrote: [facility] that prompted transfer to Nurse Triage:abdominal pain and rectal bleeding Reason for Disposition  [1] MILD-MODERATE pain AND [2] constant AND [3] present > 2 hours  Answer Assessment - Initial Assessment Questions 1. LOCATION: Where does it hurt?      RLQ, sometimes both sides on the lower quadrants 2. RADIATION: Does the pain shoot anywhere else? (e.g., chest, back)     Sometimes radiates to the back 3. ONSET: When did the pain begin? (Minutes, hours or days ago)      2 weeks 4. SUDDEN: Gradual or sudden onset?     gradual 5. PATTERN Does the pain come and go, or is it constant?    - If it comes and goes: How long does it last? Do you have pain now?     (Note: Comes and goes means the pain is intermittent. It goes away completely between bouts.)    - If constant: Is it getting better, staying the same, or getting worse?      (Note: Constant means the pain never goes away completely; most serious pain is constant and gets worse.)      intermittent 6. SEVERITY: How bad is the pain?  (e.g., Scale 1-10; mild, moderate, or severe)    - MILD (1-3): Doesn't interfere with normal activities, abdomen soft and not tender to touch.     - MODERATE (4-7): Interferes with normal activities or awakens from sleep, abdomen tender to touch.     - SEVERE (8-10): Excruciating pain, doubled over, unable to do any normal activities.        6 7. RECURRENT SYMPTOM: Have you ever had this type of stomach pain before? If Yes, ask: When was the last time? and What happened that time?      Diverticulitis, feels the same 8. CAUSE: What do you think is causing the stomach pain?     diverticulitis 9. RELIEVING/AGGRAVATING FACTORS: What makes it better or worse? (e.g., antacids, bending or twisting motion, bowel movement)    Stay still and lay flat 10. OTHER SYMPTOMS: Do you have any other symptoms? (e.g., back pain, diarrhea, fever, urination pain, vomiting)       Blood in stool Denies dizziness, denies lightheadedness. Pt states that it somewhat reminds him of Cdif.  Protocols used: Abdominal Pain - Male-A-AH

## 2023-12-26 DIAGNOSIS — F1120 Opioid dependence, uncomplicated: Secondary | ICD-10-CM | POA: Diagnosis not present

## 2023-12-26 DIAGNOSIS — F419 Anxiety disorder, unspecified: Secondary | ICD-10-CM | POA: Diagnosis not present

## 2023-12-26 DIAGNOSIS — F17200 Nicotine dependence, unspecified, uncomplicated: Secondary | ICD-10-CM | POA: Diagnosis not present

## 2023-12-26 DIAGNOSIS — F319 Bipolar disorder, unspecified: Secondary | ICD-10-CM | POA: Diagnosis not present

## 2023-12-27 DIAGNOSIS — N3289 Other specified disorders of bladder: Secondary | ICD-10-CM | POA: Diagnosis not present

## 2023-12-27 DIAGNOSIS — K6289 Other specified diseases of anus and rectum: Secondary | ICD-10-CM | POA: Diagnosis not present

## 2023-12-27 DIAGNOSIS — R1031 Right lower quadrant pain: Secondary | ICD-10-CM | POA: Diagnosis not present

## 2023-12-27 DIAGNOSIS — K8689 Other specified diseases of pancreas: Secondary | ICD-10-CM | POA: Diagnosis not present

## 2023-12-28 DIAGNOSIS — G4733 Obstructive sleep apnea (adult) (pediatric): Secondary | ICD-10-CM | POA: Diagnosis not present

## 2024-01-03 ENCOUNTER — Ambulatory Visit: Payer: Self-pay | Admitting: Family Medicine

## 2024-01-03 ENCOUNTER — Encounter: Payer: Self-pay | Admitting: Family Medicine

## 2024-01-03 ENCOUNTER — Ambulatory Visit (INDEPENDENT_AMBULATORY_CARE_PROVIDER_SITE_OTHER): Admitting: Family Medicine

## 2024-01-03 VITALS — BP 107/69 | HR 67 | Temp 98.4°F | Ht 69.0 in | Wt 246.0 lb

## 2024-01-03 DIAGNOSIS — K625 Hemorrhage of anus and rectum: Secondary | ICD-10-CM | POA: Diagnosis not present

## 2024-01-03 DIAGNOSIS — K6289 Other specified diseases of anus and rectum: Secondary | ICD-10-CM

## 2024-01-03 DIAGNOSIS — G4733 Obstructive sleep apnea (adult) (pediatric): Secondary | ICD-10-CM | POA: Diagnosis not present

## 2024-01-03 DIAGNOSIS — F419 Anxiety disorder, unspecified: Secondary | ICD-10-CM | POA: Diagnosis not present

## 2024-01-03 DIAGNOSIS — N3289 Other specified disorders of bladder: Secondary | ICD-10-CM | POA: Diagnosis not present

## 2024-01-03 DIAGNOSIS — M5412 Radiculopathy, cervical region: Secondary | ICD-10-CM

## 2024-01-03 DIAGNOSIS — G4731 Primary central sleep apnea: Secondary | ICD-10-CM

## 2024-01-03 DIAGNOSIS — G4700 Insomnia, unspecified: Secondary | ICD-10-CM | POA: Diagnosis not present

## 2024-01-03 DIAGNOSIS — F4320 Adjustment disorder, unspecified: Secondary | ICD-10-CM | POA: Diagnosis not present

## 2024-01-03 DIAGNOSIS — F319 Bipolar disorder, unspecified: Secondary | ICD-10-CM | POA: Diagnosis not present

## 2024-01-03 LAB — MICROSCOPIC EXAMINATION
Bacteria, UA: NONE SEEN
Epithelial Cells (non renal): NONE SEEN /HPF (ref 0–10)
Renal Epithel, UA: NONE SEEN /HPF

## 2024-01-03 LAB — URINALYSIS, ROUTINE W REFLEX MICROSCOPIC
Bilirubin, UA: NEGATIVE
Glucose, UA: NEGATIVE
Leukocytes,UA: NEGATIVE
Nitrite, UA: NEGATIVE
Protein,UA: NEGATIVE
Specific Gravity, UA: 1.025 (ref 1.005–1.030)
Urobilinogen, Ur: 0.2 mg/dL (ref 0.2–1.0)
pH, UA: 6 (ref 5.0–7.5)

## 2024-01-03 MED ORDER — HYDROCORT-PRAMOXINE (PERIANAL) 1-1 % EX FOAM: 1.0000 | Freq: Three times a day (TID) | CUTANEOUS | 2 refills | Status: DC | PRN

## 2024-01-03 MED ORDER — HYDROCORTISONE ACETATE 25 MG RE SUPP: 25.0000 mg | Freq: Two times a day (BID) | RECTAL | 2 refills | Status: DC | PRN
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
## 2024-01-05 LAB — URINE CULTURE

## 2024-01-10 ENCOUNTER — Other Ambulatory Visit: Payer: Self-pay | Admitting: Family Medicine

## 2024-01-10 ENCOUNTER — Encounter: Payer: Self-pay | Admitting: Family Medicine

## 2024-01-10 DIAGNOSIS — K625 Hemorrhage of anus and rectum: Secondary | ICD-10-CM

## 2024-01-10 DIAGNOSIS — K6289 Other specified diseases of anus and rectum: Secondary | ICD-10-CM

## 2024-01-17 ENCOUNTER — Encounter (INDEPENDENT_AMBULATORY_CARE_PROVIDER_SITE_OTHER): Payer: Self-pay | Admitting: *Deleted

## 2024-01-18 ENCOUNTER — Ambulatory Visit (INDEPENDENT_AMBULATORY_CARE_PROVIDER_SITE_OTHER): Admitting: Gastroenterology

## 2024-01-18 ENCOUNTER — Encounter (INDEPENDENT_AMBULATORY_CARE_PROVIDER_SITE_OTHER): Payer: Self-pay | Admitting: Gastroenterology

## 2024-01-18 VITALS — BP 118/73 | HR 66 | Temp 98.1°F | Ht 69.0 in | Wt 248.3 lb

## 2024-01-18 DIAGNOSIS — R197 Diarrhea, unspecified: Secondary | ICD-10-CM

## 2024-01-18 DIAGNOSIS — K627 Radiation proctitis: Secondary | ICD-10-CM | POA: Insufficient documentation

## 2024-01-18 DIAGNOSIS — K921 Melena: Secondary | ICD-10-CM | POA: Insufficient documentation

## 2024-01-18 DIAGNOSIS — K909 Intestinal malabsorption, unspecified: Secondary | ICD-10-CM | POA: Insufficient documentation

## 2024-01-18 DIAGNOSIS — R198 Other specified symptoms and signs involving the digestive system and abdomen: Secondary | ICD-10-CM

## 2024-01-18 MED ORDER — PSYLLIUM 58.6 % PO PACK: 1.0000 | PACK | Freq: Two times a day (BID) | ORAL | 2 refills | Status: AC

## 2024-01-18 MED ORDER — SUCRALFATE 1 GM/10ML PO SUSP: 2.0000 g | Freq: Two times a day (BID) | ORAL | 1 refills | Status: DC

## 2024-01-18 NOTE — Patient Instructions (Signed)
 It was very nice to meet you today, as dicussed with will plan for the following :  1) Ensure adequate fluid intake: Aim for 8 glasses of water daily. Follow a high fiber diet: Include foods such as dates, prunes, pears, and kiwi. Take Miralax twice a day   2) Sucralfate  enemas twice daily  3) Colonoscopy   4) Stool samples
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
## 2024-01-19 ENCOUNTER — Other Ambulatory Visit: Payer: Self-pay | Admitting: *Deleted

## 2024-01-19 ENCOUNTER — Encounter: Payer: Self-pay | Admitting: *Deleted

## 2024-01-19 ENCOUNTER — Telehealth (INDEPENDENT_AMBULATORY_CARE_PROVIDER_SITE_OTHER): Payer: Self-pay | Admitting: Gastroenterology

## 2024-01-19 ENCOUNTER — Other Ambulatory Visit (INDEPENDENT_AMBULATORY_CARE_PROVIDER_SITE_OTHER): Payer: Self-pay

## 2024-01-19 ENCOUNTER — Telehealth: Payer: Self-pay | Admitting: *Deleted

## 2024-01-19 DIAGNOSIS — K627 Radiation proctitis: Secondary | ICD-10-CM

## 2024-01-19 MED ORDER — PEG 3350-KCL-NA BICARB-NACL 420 G PO SOLR: 4000.0000 mL | Freq: Once | ORAL | 0 refills | Status: AC

## 2024-01-19 MED ORDER — SUCRALFATE 1 GM/10ML PO SUSP
2.0000 g | Freq: Two times a day (BID) | ORAL | 1 refills | Status: DC
Start: 2024-01-19 — End: 2024-02-09

## 2024-01-19 NOTE — Telephone Encounter (Signed)
 Hi Francisco [name]  Can you please contact the pharmacy and inform them that the instruction and route (per rectum) is in the order of the medication when prescribed .   Place 20 mLs (2 g total) rectally 2 (two) times daily. Sucralfate  enemas (2gm in 20ml water twice daily)

## 2024-01-19 NOTE — Telephone Encounter (Signed)
 Pharmacy contacted and instructions given. Pharmacist tech states the order was sent in as oral suspension. Overheard pharmacist in background and he stated the Sulcrafate does not come in enemas. Pharmacy tech relayed information that sulcrafate does not come in enema. Please advise. Thank you!

## 2024-01-19 NOTE — Telephone Encounter (Signed)
 Carelon PA: Order ID: [id number]       Completed  Approval Valid Through: 01/19/2024 - 03/18/2024

## 2024-01-19 NOTE — Telephone Encounter (Signed)
 Thank you Glenys for taking care of this

## 2024-01-19 NOTE — Telephone Encounter (Signed)
  Hi [name]  Please tell them to see this instruction in order .    Order Details Dose: 2 g Route: Rectal Frequency: 2 times daily  Dispense Quantity: 420 mL (11 day supply) Refills: 1   Duration: 30 days Dispense As Written: No   Indications of Use: Post Irradiation Proctitis       Sig: Place 20 mLs (2 g total) rectally 2 (two) times daily. Sucralfate  enemas (2gm in 20ml water twice daily)

## 2024-01-19 NOTE — Telephone Encounter (Signed)
 Pt called in and states that the oral suspension was sent in to pharmacy instead of enemas. Please advise. Thank you!

## 2024-01-19 NOTE — Telephone Encounter (Signed)
 [facility] pharmacist stated that the sulcrafate does not come in enemas. I contacted [facility] in [place] to see if they had the enemas. Pharmacy tech stated they do not have the enemas in stock but if we send a script in they may be able to order them. Contacted pt wife to see if they would be ok if script sent to [facility], pt wife was ok with this. Resent script to [facility].

## 2024-01-20 DIAGNOSIS — R197 Diarrhea, unspecified: Secondary | ICD-10-CM | POA: Diagnosis not present

## 2024-01-20 DIAGNOSIS — K921 Melena: Secondary | ICD-10-CM | POA: Diagnosis not present

## 2024-01-20 DIAGNOSIS — K909 Intestinal malabsorption, unspecified: Secondary | ICD-10-CM | POA: Diagnosis not present

## 2024-01-20 DIAGNOSIS — K627 Radiation proctitis: Secondary | ICD-10-CM | POA: Diagnosis not present

## 2024-01-22 LAB — GI PROFILE, STOOL, PCR

## 2024-01-23 ENCOUNTER — Ambulatory Visit (INDEPENDENT_AMBULATORY_CARE_PROVIDER_SITE_OTHER): Payer: Self-pay | Admitting: Gastroenterology

## 2024-01-23 NOTE — Progress Notes (Signed)
 Hi [name] ,  Can you please call the patient and tell the patient the stool sample is positive for a viral infection . Please remind him to see my message on mychart   Thanks,  Jovonta Levit Faizan Shequita Peplinski, MD Gastroenterology and Hepatology [facility] Stool GI PCR positive for norovirus Negative for C. difficile

## 2024-01-24 ENCOUNTER — Encounter (INDEPENDENT_AMBULATORY_CARE_PROVIDER_SITE_OTHER): Payer: Self-pay

## 2024-01-24 LAB — C DIFFICILE TOXINS A+B W/RFLX: C difficile Toxins A+B, EIA: NEGATIVE

## 2024-01-24 LAB — C DIFFICILE, CYTOTOXIN B

## 2024-01-28 DIAGNOSIS — G4733 Obstructive sleep apnea (adult) (pediatric): Secondary | ICD-10-CM | POA: Diagnosis not present

## 2024-01-30 DIAGNOSIS — F1210 Cannabis abuse, uncomplicated: Secondary | ICD-10-CM | POA: Diagnosis not present

## 2024-01-30 DIAGNOSIS — F319 Bipolar disorder, unspecified: Secondary | ICD-10-CM | POA: Diagnosis not present

## 2024-01-30 DIAGNOSIS — F17200 Nicotine dependence, unspecified, uncomplicated: Secondary | ICD-10-CM | POA: Diagnosis not present

## 2024-01-30 DIAGNOSIS — F1120 Opioid dependence, uncomplicated: Secondary | ICD-10-CM | POA: Diagnosis not present

## 2024-01-31 ENCOUNTER — Telehealth: Payer: Self-pay | Admitting: Family Medicine

## 2024-01-31 DIAGNOSIS — Z0279 Encounter for issue of other medical certificate: Secondary | ICD-10-CM

## 2024-01-31 DIAGNOSIS — G4733 Obstructive sleep apnea (adult) (pediatric): Secondary | ICD-10-CM | POA: Diagnosis not present

## 2024-01-31 NOTE — Telephone Encounter (Signed)
 PT dropped off FMLA forms to be completed and signed.  Form Fee Paid? (YES)            If NO, form is placed on front office manager desk to hold until payment received. If YES, then form will be placed in the RX/HH Nurse Coordinators box for completion.  Form will not be processed until payment is received

## 2024-02-02 ENCOUNTER — Other Ambulatory Visit: Payer: Self-pay | Admitting: Family Medicine

## 2024-02-02 DIAGNOSIS — K219 Gastro-esophageal reflux disease without esophagitis: Secondary | ICD-10-CM

## 2024-02-03 NOTE — Telephone Encounter (Signed)
 PCP completed and signed FMLA forms. Pt to pick them up. Patient has been contacted and informed they are complete. Also copy for patient up front

## 2024-02-07 ENCOUNTER — Encounter (HOSPITAL_COMMUNITY)
Admission: RE | Admit: 2024-02-07 | Discharge: 2024-02-07 | Disposition: A | Discharge status: Home / Self Care | Source: Ambulatory Visit | Attending: Gastroenterology | Admitting: Gastroenterology

## 2024-02-07 ENCOUNTER — Encounter (HOSPITAL_COMMUNITY): Payer: Self-pay

## 2024-02-07 ENCOUNTER — Other Ambulatory Visit: Payer: Self-pay

## 2024-02-07 HISTORY — DX: Anxiety disorder, unspecified: F41.9

## 2024-02-07 HISTORY — DX: Depression, unspecified: F32.A

## 2024-02-09 ENCOUNTER — Ambulatory Visit (HOSPITAL_COMMUNITY)
Admission: RE | Admit: 2024-02-09 | Discharge: 2024-02-09 | Disposition: A | Discharge status: Home / Self Care | Attending: Gastroenterology | Admitting: Gastroenterology

## 2024-02-09 ENCOUNTER — Other Ambulatory Visit: Payer: Self-pay

## 2024-02-09 ENCOUNTER — Ambulatory Visit (HOSPITAL_COMMUNITY): Admitting: Anesthesiology

## 2024-02-09 ENCOUNTER — Encounter (HOSPITAL_COMMUNITY): Payer: Self-pay | Admitting: Gastroenterology

## 2024-02-09 ENCOUNTER — Encounter (HOSPITAL_COMMUNITY)
Admission: RE | Disposition: A | Discharge status: Home / Self Care | Payer: Self-pay | Source: Home / Self Care | Attending: Gastroenterology

## 2024-02-09 DIAGNOSIS — D125 Benign neoplasm of sigmoid colon: Secondary | ICD-10-CM | POA: Diagnosis not present

## 2024-02-09 DIAGNOSIS — K921 Melena: Secondary | ICD-10-CM | POA: Insufficient documentation

## 2024-02-09 DIAGNOSIS — C61 Malignant neoplasm of prostate: Secondary | ICD-10-CM | POA: Insufficient documentation

## 2024-02-09 DIAGNOSIS — I10 Essential (primary) hypertension: Secondary | ICD-10-CM | POA: Diagnosis not present

## 2024-02-09 DIAGNOSIS — Z87891 Personal history of nicotine dependence: Secondary | ICD-10-CM | POA: Insufficient documentation

## 2024-02-09 DIAGNOSIS — K635 Polyp of colon: Secondary | ICD-10-CM | POA: Diagnosis not present

## 2024-02-09 DIAGNOSIS — Z923 Personal history of irradiation: Secondary | ICD-10-CM | POA: Insufficient documentation

## 2024-02-09 DIAGNOSIS — D123 Benign neoplasm of transverse colon: Secondary | ICD-10-CM | POA: Insufficient documentation

## 2024-02-09 DIAGNOSIS — K529 Noninfective gastroenteritis and colitis, unspecified: Secondary | ICD-10-CM | POA: Diagnosis not present

## 2024-02-09 DIAGNOSIS — G4730 Sleep apnea, unspecified: Secondary | ICD-10-CM | POA: Diagnosis not present

## 2024-02-09 DIAGNOSIS — K648 Other hemorrhoids: Secondary | ICD-10-CM | POA: Insufficient documentation

## 2024-02-09 DIAGNOSIS — D122 Benign neoplasm of ascending colon: Secondary | ICD-10-CM | POA: Diagnosis not present

## 2024-02-09 DIAGNOSIS — K6389 Other specified diseases of intestine: Secondary | ICD-10-CM | POA: Diagnosis not present

## 2024-02-09 DIAGNOSIS — K626 Ulcer of anus and rectum: Secondary | ICD-10-CM | POA: Diagnosis not present

## 2024-02-09 DIAGNOSIS — K630 Abscess of intestine: Secondary | ICD-10-CM | POA: Diagnosis not present

## 2024-02-09 DIAGNOSIS — K219 Gastro-esophageal reflux disease without esophagitis: Secondary | ICD-10-CM | POA: Diagnosis not present

## 2024-02-09 DIAGNOSIS — F418 Other specified anxiety disorders: Secondary | ICD-10-CM | POA: Insufficient documentation

## 2024-02-09 DIAGNOSIS — K6289 Other specified diseases of anus and rectum: Secondary | ICD-10-CM

## 2024-02-09 HISTORY — PX: COLONOSCOPY: SHX5424

## 2024-02-09 LAB — HM COLONOSCOPY

## 2024-02-09 SURGERY — COLONOSCOPY
Anesthesia: General

## 2024-02-09 MED ORDER — PREDNISONE 10 MG PO TABS: 40.0000 mg | ORAL_TABLET | Freq: Every day | ORAL | 1 refills | Status: DC

## 2024-02-09 MED ORDER — LIDOCAINE 2% (20 MG/ML) 5 ML SYRINGE
INTRAMUSCULAR | Status: DC | PRN
  Administered 2024-02-09: 100 mg via INTRAVENOUS

## 2024-02-09 MED ORDER — MESALAMINE 4 G RE ENEM: 4.0000 g | ENEMA | Freq: Every day | RECTAL | 2 refills | Status: AC

## 2024-02-09 MED ORDER — LACTATED RINGERS IV SOLN: INTRAVENOUS | Status: DC | PRN

## 2024-02-09 MED ORDER — PROPOFOL 10 MG/ML IV BOLUS
INTRAVENOUS | Status: DC | PRN
  Administered 2024-02-09: 250 ug/kg/min via INTRAVENOUS
  Administered 2024-02-09: 100 mg via INTRAVENOUS

## 2024-02-09 MED ORDER — MESALAMINE 1.2 G PO TBEC: 4.8000 g | DELAYED_RELEASE_TABLET | Freq: Every day | ORAL | 2 refills | Status: DC

## 2024-02-09 MED ORDER — DEXMEDETOMIDINE HCL IN NACL 80 MCG/20ML IV SOLN
INTRAVENOUS | Status: DC | PRN
Start: 2024-02-09 — End: 2024-02-09
  Administered 2024-02-09: 8 ug via INTRAVENOUS

## 2024-02-09 NOTE — Anesthesia Procedure Notes (Signed)
 Date/Time: 02/09/2024 12:59 PM  Performed by: Para Jerelene CROME, CRNAOxygen Delivery Method: Nasal cannula

## 2024-02-09 NOTE — Op Note (Signed)
 [facility] Patient Name: Francisco [name] Procedure Date: 02/09/2024 11:50 AM MRN: [id number] Date of Birth: Apr 12, 1971 Attending MD: Deatrice Dine , MD, [id number] CSN: [id number] Age: 53 Admit Type: Outpatient Procedure:                Colonoscopy Indications:              Chronic diarrhea, Hematochezia Providers:                Deatrice Dine, MD, Crystal Page, [place] Wilson,                            Technician Referring MD:              Medicines:                Monitored Anesthesia Care Complications:            No immediate complications. Estimated Blood Loss:     Estimated blood loss was minimal. Procedure:                Pre-Anesthesia Assessment:                           - Prior to the procedure, a History and Physical                            was performed, and patient medications and                            allergies were reviewed. The patient's tolerance of                            previous anesthesia was also reviewed. The risks                            and benefits of the procedure and the sedation                            options and risks were discussed with the patient.                            All questions were answered, and informed consent                            was obtained. Prior Anticoagulants: The patient has                            taken no anticoagulant or antiplatelet agents. ASA                            Grade Assessment: III - A patient with severe                            systemic disease. After reviewing the risks and  benefits, the patient was deemed in satisfactory                            condition to undergo the procedure.                           After obtaining informed consent, the colonoscope                            was passed under direct vision. Throughout the                            procedure, the patient's blood pressure, pulse, and                            oxygen saturations  were monitored continuously. The                            [id number]-HQ190L ([id number]) Peds Colon was introduced                            through the anus and advanced to the the terminal                            ileum. The colonoscopy was performed without                            difficulty. The patient tolerated the procedure                            well. The quality of the bowel preparation was                            evaluated using the BBPS [facility] Bowel Preparation                            Scale) with scores of: Right Colon = 3, Transverse                            Colon = 3 and Left Colon = 3 (entire mucosa seen                            well with no residual staining, small fragments of                            stool or opaque liquid). The total BBPS score                            equals 9. The terminal ileum, ileocecal valve,                            appendiceal orifice, and rectum were photographed. Scope In: 1:05:11 PM Scope Out: 1:32:58 PM Scope Withdrawal Time: 0  hours 23 minutes 37 seconds  Total Procedure Duration: 0 hours 27 minutes 47 seconds  Findings:      The perianal and digital rectal examinations were normal.      Six sessile polyps were found in the sigmoid colon, transverse colon and       ascending colon. The polyps were 4 to 7 mm in size. These polyps were       removed with a cold snare. Resection and retrieval were complete.      The terminal ileum appeared normal. Biopsies were taken with a cold       forceps for histology.      Diffuse severe inflammation characterized by friability, loss of       vascularity, mucus and confluent ulcerations was found in the rectum and       in the recto-sigmoid colon. Biopsies were taken with a cold forceps for       histology.      Non-bleeding internal hemorrhoids were found during endoscopy. The       hemorrhoids were medium-sized. Impression:               - Six 4 to 7 mm polyps in the sigmoid colon,  in the                            transverse colon and in the ascending colon,                            removed with a cold snare. Resected and retrieved.                           - The examined portion of the ileum was normal.                            Biopsied.                           - Diffuse severe inflammation was found in the                            rectum and in the recto-sigmoid colon, rule out                            inflammatory bowel disease. Biopsied.                           - Non-bleeding internal hemorrhoids. Moderate Sedation:      Per Anesthesia Care Recommendation:           - Patient has a contact number available for                            emergencies. The signs and symptoms of potential                            delayed complications were discussed with the                            patient.  Return to normal activities tomorrow.                            Written discharge instructions were provided to the                            patient.                           - Resume previous diet.                           - Continue present medications.                           - Await pathology results.                           - Repeat colonoscopy for surveillance based on                            pathology results.                           - Return to GI clinic as previously scheduled.                           -Lab work ( fecl calpro, crp, hep profile ,                            quantiferon )                           - once biopsies are back and consistent with IBD                            can start prednisone  fast taper , mesalamine  PO and                            enema Procedure Code(s):        --- Professional ---                           [phone], Colonoscopy, flexible; with removal of                            tumor(s), polyp(s), or other lesion(s) by snare                            technique                           45380, 59,  Colonoscopy, flexible; with biopsy,                            single or multiple Diagnosis Code(s):        --- Professional ---  D12.5, Benign neoplasm of sigmoid colon                           D12.3, Benign neoplasm of transverse colon (hepatic                            flexure or splenic flexure)                           D12.2, Benign neoplasm of ascending colon                           K64.8, Other hemorrhoids                           K62.89, Other specified diseases of anus and rectum                           K52.9, Noninfective gastroenteritis and colitis,                            unspecified                           K92.1, Melena (includes Hematochezia) CPT copyright 2022 American Medical Association. All rights reserved. The codes documented in this report are preliminary and upon coder review may  be revised to meet current compliance requirements. Deatrice Dine, MD Deatrice Dine, MD 02/09/2024 1:45:43 PM This report has been signed electronically. Number of Addenda: 0

## 2024-02-09 NOTE — Discharge Instructions (Addendum)
  Discharge instructions Please read the instructions outlined below and refer to this sheet in the next few weeks. These discharge instructions provide you with general information on caring for yourself after you leave the hospital. Your doctor may also give you specific instructions. While your treatment has been planned according to the most current medical practices available, unavoidable complications occasionally occur. If you have any problems or questions after discharge, please call your doctor. ACTIVITY You may resume your regular activity but move at a slower pace for the next 24 hours.  Take frequent rest periods for the next 24 hours.  Walking will help expel (get rid of) the air and reduce the bloated feeling in your abdomen.  No driving for 24 hours (because of the anesthesia (medicine) used during the test).  You may shower.  Do not sign any important legal documents or operate any machinery for 24 hours (because of the anesthesia used during the test).  NUTRITION Drink plenty of fluids.  You may resume your normal diet.  Begin with a light meal and progress to your normal diet.  Avoid alcoholic beverages for 24 hours or as instructed by your caregiver.  MEDICATIONS You may resume your normal medications unless your caregiver tells you otherwise.  WHAT YOU CAN EXPECT TODAY You may experience abdominal discomfort such as a feeling of fullness or "gas" pains.  FOLLOW-UP Your doctor will discuss the results of your test with you.  SEEK IMMEDIATE MEDICAL ATTENTION IF ANY OF THE FOLLOWING OCCUR: Excessive nausea (feeling sick to your stomach) and/or vomiting.  Severe abdominal pain and distention (swelling).  Trouble swallowing.  Temperature over 101 F (37.8 C).  Rectal bleeding or vomiting of blood.    Lab work and Stool samples prednisone  40 mg qday for one week, then decrease by 10 mg every week Start mesalamine  oral and enema   I hope you have a great rest of your  week!   Siyana Erney Faizan Arvie Villarruel , M.D.. Gastroenterology and Hepatology [facility]

## 2024-02-09 NOTE — Interval H&P Note (Signed)
 History and Physical Interval Note:  02/09/2024 11:56 AM  Francisco [name]  has presented today for surgery, with the diagnosis of HEMATACHEZIA.  The various methods of treatment have been discussed with the patient and family. After consideration of risks, benefits and other options for treatment, the patient has consented to  Procedure(s) with comments: COLONOSCOPY (N/A) - 1:15 PM, ASA 1-2 as a surgical intervention.  The patient's history has been reviewed, patient examined, no change in status, stable for surgery.  I have reviewed the patient's chart and labs.  Questions were answered to the patient's satisfaction.     Deatrice FALCON Yanelis Osika

## 2024-02-09 NOTE — Anesthesia Preprocedure Evaluation (Signed)
 Anesthesia Evaluation  Patient identified by MRN, date of birth, ID band Patient awake    Reviewed: Allergy & Precautions, H&P , NPO status , Patient's Chart, lab work & pertinent test results, reviewed documented beta blocker date and time   Airway Mallampati: II  TM Distance: >3 FB Neck ROM: full    Dental no notable dental hx.    Pulmonary sleep apnea , former smoker   Pulmonary exam normal breath sounds clear to auscultation       Cardiovascular Exercise Tolerance: Good hypertension, negative cardio ROS  Rhythm:regular Rate:Normal     Neuro/Psych  Headaches PSYCHIATRIC DISORDERS Anxiety Depression       GI/Hepatic Neg liver ROS,GERD  ,,  Endo/Other  negative endocrine ROS    Renal/GU negative Renal ROS  negative genitourinary   Musculoskeletal   Abdominal   Peds  Hematology negative hematology ROS (+)   Anesthesia Other Findings   Reproductive/Obstetrics negative OB ROS                              Anesthesia Physical Anesthesia Plan  ASA: 2  Anesthesia Plan: General   Post-op Pain Management:    Induction:   PONV Risk Score and Plan: Propofol  infusion  Airway Management Planned:   Additional Equipment:   Intra-op Plan:   Post-operative Plan:   Informed Consent: I have reviewed the patients History and Physical, chart, labs and discussed the procedure including the risks, benefits and alternatives for the proposed anesthesia with the patient or authorized representative who has indicated his/her understanding and acceptance.     Dental Advisory Given  Plan Discussed with: CRNA  Anesthesia Plan Comments:         Anesthesia Quick Evaluation

## 2024-02-09 NOTE — Transfer of Care (Addendum)
 Immediate Anesthesia Transfer of Care Note  Patient: Francisco [name]  Procedure(s) Performed: COLONOSCOPY  Patient Location: Short Stay  Anesthesia Type:General  Level of Consciousness: drowsy and patient cooperative  Airway & Oxygen Therapy: Patient Spontanous Breathing  Post-op Assessment: Report given to RN and Post -op Vital signs reviewed and stable  Post vital signs: Reviewed and stable  Last Vitals:  Vitals Value Taken Time  BP 105/66 02/09/24   1339  Temp 36.5 02/09/24   1339  Pulse 73 02/09/24   1339  Resp 14 02/09/24   1339  SpO2 96% 02/09/24   1339    Last Pain:  Vitals:   02/09/24 1259  TempSrc:   PainSc: 0-No pain      Patients Stated Pain Goal: 5 (02/09/24 1156)  Complications: No notable events documented.

## 2024-02-10 ENCOUNTER — Encounter (INDEPENDENT_AMBULATORY_CARE_PROVIDER_SITE_OTHER): Payer: Self-pay | Admitting: *Deleted

## 2024-02-10 ENCOUNTER — Encounter (HOSPITAL_COMMUNITY): Payer: Self-pay | Admitting: Gastroenterology

## 2024-02-10 LAB — SURGICAL PATHOLOGY

## 2024-02-13 ENCOUNTER — Ambulatory Visit (INDEPENDENT_AMBULATORY_CARE_PROVIDER_SITE_OTHER): Payer: Self-pay | Admitting: Gastroenterology

## 2024-02-16 NOTE — Anesthesia Postprocedure Evaluation (Signed)
 Anesthesia Post Note  Patient: Francisco [name]  Procedure(s) Performed: COLONOSCOPY  Patient location during evaluation: Phase II Anesthesia Type: General Level of consciousness: awake Pain management: pain level controlled Vital Signs Assessment: post-procedure vital signs reviewed and stable Respiratory status: spontaneous breathing and respiratory function stable Cardiovascular status: blood pressure returned to baseline and stable Postop Assessment: no headache and no apparent nausea or vomiting Anesthetic complications: no Comments: Late entry   No notable events documented.   Last Vitals:  Vitals:   02/09/24 1156 02/09/24 1339  BP: 116/80 105/66  Pulse: 60 73  Resp: 15 14  Temp: 36.4 C 36.5 C  SpO2: 97% 96%    Last Pain:  Vitals:   02/09/24 1339  TempSrc: Oral  PainSc:                  [name]

## 2024-02-27 NOTE — Progress Notes (Signed)
 Patient result letter mailed Patient's PCP is on EPIC

## 2024-02-28 DIAGNOSIS — G4733 Obstructive sleep apnea (adult) (pediatric): Secondary | ICD-10-CM | POA: Diagnosis not present

## 2024-03-01 ENCOUNTER — Encounter (INDEPENDENT_AMBULATORY_CARE_PROVIDER_SITE_OTHER): Payer: Self-pay | Admitting: Gastroenterology

## 2024-03-07 DIAGNOSIS — F419 Anxiety disorder, unspecified: Secondary | ICD-10-CM | POA: Diagnosis not present

## 2024-03-07 DIAGNOSIS — F319 Bipolar disorder, unspecified: Secondary | ICD-10-CM | POA: Diagnosis not present

## 2024-03-07 DIAGNOSIS — G4700 Insomnia, unspecified: Secondary | ICD-10-CM | POA: Diagnosis not present

## 2024-03-07 DIAGNOSIS — F4320 Adjustment disorder, unspecified: Secondary | ICD-10-CM | POA: Diagnosis not present

## 2024-03-08 DIAGNOSIS — F319 Bipolar disorder, unspecified: Secondary | ICD-10-CM | POA: Diagnosis not present

## 2024-03-08 DIAGNOSIS — F1120 Opioid dependence, uncomplicated: Secondary | ICD-10-CM | POA: Diagnosis not present

## 2024-03-08 DIAGNOSIS — F17200 Nicotine dependence, unspecified, uncomplicated: Secondary | ICD-10-CM | POA: Diagnosis not present

## 2024-03-08 DIAGNOSIS — F419 Anxiety disorder, unspecified: Secondary | ICD-10-CM | POA: Diagnosis not present

## 2024-03-27 ENCOUNTER — Ambulatory Visit (INDEPENDENT_AMBULATORY_CARE_PROVIDER_SITE_OTHER): Admitting: Gastroenterology

## 2024-03-29 ENCOUNTER — Other Ambulatory Visit (INDEPENDENT_AMBULATORY_CARE_PROVIDER_SITE_OTHER): Payer: Self-pay | Admitting: Gastroenterology

## 2024-03-29 DIAGNOSIS — G4733 Obstructive sleep apnea (adult) (pediatric): Secondary | ICD-10-CM | POA: Diagnosis not present

## 2024-03-29 NOTE — Telephone Encounter (Signed)
 I called and left a Vm asked that the patient please return call to the office.

## 2024-03-29 NOTE — Telephone Encounter (Signed)
 Please ask patient if he already had 1 month of prednisone  ? If yes than no need to take steroids again , please remind him to see us  in clinic in upcoming appointment

## 2024-03-30 NOTE — Telephone Encounter (Signed)
 I spoke with the patient and he has already finished the one month course of Prednisone  and says he did not request this.

## 2024-04-04 ENCOUNTER — Ambulatory Visit

## 2024-04-04 ENCOUNTER — Encounter (INDEPENDENT_AMBULATORY_CARE_PROVIDER_SITE_OTHER): Payer: Self-pay | Admitting: Gastroenterology

## 2024-04-04 ENCOUNTER — Encounter: Payer: Self-pay | Admitting: Family Medicine

## 2024-04-04 ENCOUNTER — Ambulatory Visit: Admitting: Family Medicine

## 2024-04-05 DIAGNOSIS — F419 Anxiety disorder, unspecified: Secondary | ICD-10-CM | POA: Diagnosis not present

## 2024-04-05 DIAGNOSIS — G4700 Insomnia, unspecified: Secondary | ICD-10-CM | POA: Diagnosis not present

## 2024-04-05 DIAGNOSIS — F4320 Adjustment disorder, unspecified: Secondary | ICD-10-CM | POA: Diagnosis not present

## 2024-04-05 DIAGNOSIS — F17200 Nicotine dependence, unspecified, uncomplicated: Secondary | ICD-10-CM | POA: Diagnosis not present

## 2024-04-09 DIAGNOSIS — F17200 Nicotine dependence, unspecified, uncomplicated: Secondary | ICD-10-CM | POA: Diagnosis not present

## 2024-04-09 DIAGNOSIS — F419 Anxiety disorder, unspecified: Secondary | ICD-10-CM | POA: Diagnosis not present

## 2024-04-09 DIAGNOSIS — F319 Bipolar disorder, unspecified: Secondary | ICD-10-CM | POA: Diagnosis not present

## 2024-04-09 DIAGNOSIS — F1120 Opioid dependence, uncomplicated: Secondary | ICD-10-CM | POA: Diagnosis not present

## 2024-04-10 ENCOUNTER — Encounter (INDEPENDENT_AMBULATORY_CARE_PROVIDER_SITE_OTHER): Payer: Self-pay | Admitting: Gastroenterology

## 2024-04-10 ENCOUNTER — Encounter: Payer: Self-pay | Admitting: Family Medicine

## 2024-04-10 ENCOUNTER — Telehealth (INDEPENDENT_AMBULATORY_CARE_PROVIDER_SITE_OTHER): Payer: Self-pay

## 2024-04-10 ENCOUNTER — Ambulatory Visit (INDEPENDENT_AMBULATORY_CARE_PROVIDER_SITE_OTHER): Admitting: Gastroenterology

## 2024-04-10 ENCOUNTER — Ambulatory Visit: Payer: Self-pay

## 2024-04-10 VITALS — BP 140/73 | HR 112 | Temp 97.3°F | Ht 69.0 in | Wt 212.2 lb

## 2024-04-10 DIAGNOSIS — K51011 Ulcerative (chronic) pancolitis with rectal bleeding: Secondary | ICD-10-CM | POA: Diagnosis not present

## 2024-04-10 DIAGNOSIS — R634 Abnormal weight loss: Secondary | ICD-10-CM | POA: Insufficient documentation

## 2024-04-10 DIAGNOSIS — R6881 Early satiety: Secondary | ICD-10-CM | POA: Diagnosis not present

## 2024-04-10 MED ORDER — MESALAMINE 1.2 G PO TBEC: 4.8000 g | DELAYED_RELEASE_TABLET | Freq: Every day | ORAL | 2 refills | Status: DC

## 2024-04-10 NOTE — Telephone Encounter (Signed)
 PA on [facility] for EGD: Order ID: [id number]       In Progress  Anticipated Determination Date: 04/11/2024

## 2024-04-10 NOTE — Telephone Encounter (Signed)
 Spoke to patients wife per signed DPR. Appointment made 04/11/2024 at 10:35 am with Dr. Jolinda.

## 2024-04-10 NOTE — Telephone Encounter (Signed)
 FYI Only or Action Required?: Action required by provider: request for appointment.  Patient was last seen in primary care on 01/03/2024 by Francisco Norene HERO, DO.  Called Nurse Triage reporting Fatigue.  Symptoms began several weeks ago.  Interventions attempted: Rest, hydration, or home remedies.  Symptoms are: gradually worsening. Fatigue, weight loss, obsessing over different things. No availability with PCP. Wife asking pt. Be worked in with PCP.  Triage Disposition: See HCP Within 4 Hours (Or PCP Triage)  Patient/caregiver understands and will follow disposition?: Yes     Copied from CRM #[id number]. Topic: Clinical - Red Word Triage >> Apr 10, 2024 12:52 PM [name] wrote: Red Word that prompted transfer to Nurse Triage: unexplained weight loss,fatigue, mental health decrease    ----------------------------------------------------------------------- From previous Reason for Contact - Scheduling: Patient/patient representative is calling to schedule an appointment. Refer to attachments for appointment information. Answer Assessment - Initial Assessment Questions 1. DESCRIPTION: Describe how you are feeling.     fatigue 2. SEVERITY: How bad is it?  Can you stand and walk?     yes 3. ONSET: When did these symptoms begin? (e.g., hours, days, weeks, months)     6 weeks 4. CAUSE: What do you think is causing the weakness or fatigue? (e.g., not drinking enough fluids, medical problem, trouble sleeping)     unsure 5. NEW MEDICINES:  Have you started on any new medicines recently? (e.g., opioid pain medicines, benzodiazepines, muscle relaxants, antidepressants, antihistamines, neuroleptics, beta blockers)     no 6. OTHER SYMPTOMS: Do you have any other symptoms? (e.g., chest pain, fever, cough, SOB, vomiting, diarrhea, bleeding, other areas of pain)     Forgetful, obsessions  7. PREGNANCY: Is there any chance you are pregnant? When was your last menstrual  period?     N/a  Protocols used: Weakness (Generalized) and Fatigue-A-AH  Reason for Disposition  [1] MODERATE weakness (e.g., interferes with work, school, normal activities) AND [2] cause unknown  (Exceptions: Weakness from acute minor illness or poor fluid intake; weakness is chronic and not worse.)  Answer Assessment - Initial Assessment Questions 1. DESCRIPTION: Describe how you are feeling.     fatigue 2. SEVERITY: How bad is it?  Can you stand and walk?     yes 3. ONSET: When did these symptoms begin? (e.g., hours, days, weeks, months)     6 weeks 4. CAUSE: What do you think is causing the weakness or fatigue? (e.g., not drinking enough fluids, medical problem, trouble sleeping)     unsure 5. NEW MEDICINES:  Have you started on any new medicines recently? (e.g., opioid pain medicines, benzodiazepines, muscle relaxants, antidepressants, antihistamines, neuroleptics, beta blockers)     no 6. OTHER SYMPTOMS: Do you have any other symptoms? (e.g., chest pain, fever, cough, SOB, vomiting, diarrhea, bleeding, other areas of pain)     Forgetful, obsessions  7. PREGNANCY: Is there any chance you are pregnant? When was your last menstrual period?     N/a  Protocols used: Weakness (Generalized) and Fatigue-A-AH

## 2024-04-10 NOTE — Patient Instructions (Signed)
 It was very nice to meet you today, as dicussed with will plan for the following :  1) upper endoscopy  2) talk to your PCP for blood work
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
## 2024-04-10 NOTE — Telephone Encounter (Signed)
 Patient seen in the office today, scheduled EGD for 04/12/2024 at 8:00am. Instructions given to patient in hand.

## 2024-04-11 ENCOUNTER — Other Ambulatory Visit (HOSPITAL_COMMUNITY): Payer: Self-pay | Admitting: Family Medicine

## 2024-04-11 ENCOUNTER — Ambulatory Visit (HOSPITAL_COMMUNITY)
Admission: RE | Admit: 2024-04-11 | Discharge: 2024-04-11 | Disposition: A | Discharge status: Home / Self Care | Source: Ambulatory Visit | Attending: Family Medicine | Admitting: Family Medicine

## 2024-04-11 ENCOUNTER — Other Ambulatory Visit: Payer: Self-pay | Admitting: Family Medicine

## 2024-04-11 ENCOUNTER — Ambulatory Visit: Payer: Self-pay | Admitting: Family Medicine

## 2024-04-11 ENCOUNTER — Ambulatory Visit (HOSPITAL_COMMUNITY)
Admission: RE | Admit: 2024-04-11 | Discharge: 2024-04-11 | Disposition: A | Source: Ambulatory Visit | Attending: Family Medicine

## 2024-04-11 ENCOUNTER — Encounter (HOSPITAL_COMMUNITY)
Admission: RE | Admit: 2024-04-11 | Discharge: 2024-04-11 | Disposition: A | Discharge status: Home / Self Care | Source: Ambulatory Visit | Attending: Gastroenterology | Admitting: Gastroenterology

## 2024-04-11 ENCOUNTER — Ambulatory Visit (INDEPENDENT_AMBULATORY_CARE_PROVIDER_SITE_OTHER)

## 2024-04-11 ENCOUNTER — Ambulatory Visit: Admitting: Family Medicine

## 2024-04-11 ENCOUNTER — Encounter: Payer: Self-pay | Admitting: Family Medicine

## 2024-04-11 VITALS — BP 122/86 | HR 89 | Temp 97.9°F | Ht 69.0 in | Wt 214.0 lb

## 2024-04-11 DIAGNOSIS — M545 Low back pain, unspecified: Secondary | ICD-10-CM

## 2024-04-11 DIAGNOSIS — R42 Dizziness and giddiness: Secondary | ICD-10-CM

## 2024-04-11 DIAGNOSIS — S058X9A Other injuries of unspecified eye and orbit, initial encounter: Secondary | ICD-10-CM

## 2024-04-11 DIAGNOSIS — R634 Abnormal weight loss: Secondary | ICD-10-CM

## 2024-04-11 DIAGNOSIS — R55 Syncope and collapse: Secondary | ICD-10-CM | POA: Diagnosis not present

## 2024-04-11 DIAGNOSIS — K51011 Ulcerative (chronic) pancolitis with rectal bleeding: Secondary | ICD-10-CM | POA: Diagnosis not present

## 2024-04-11 DIAGNOSIS — R5382 Chronic fatigue, unspecified: Secondary | ICD-10-CM

## 2024-04-11 DIAGNOSIS — Z1389 Encounter for screening for other disorder: Secondary | ICD-10-CM | POA: Diagnosis not present

## 2024-04-11 DIAGNOSIS — R8290 Unspecified abnormal findings in urine: Secondary | ICD-10-CM

## 2024-04-11 DIAGNOSIS — F332 Major depressive disorder, recurrent severe without psychotic features: Secondary | ICD-10-CM

## 2024-04-11 DIAGNOSIS — C61 Malignant neoplasm of prostate: Secondary | ICD-10-CM

## 2024-04-11 DIAGNOSIS — M25552 Pain in left hip: Secondary | ICD-10-CM | POA: Diagnosis not present

## 2024-04-11 DIAGNOSIS — R519 Headache, unspecified: Secondary | ICD-10-CM | POA: Diagnosis not present

## 2024-04-11 DIAGNOSIS — R4182 Altered mental status, unspecified: Secondary | ICD-10-CM | POA: Diagnosis not present

## 2024-04-11 DIAGNOSIS — M5135 Other intervertebral disc degeneration, thoracolumbar region: Secondary | ICD-10-CM | POA: Diagnosis not present

## 2024-04-11 DIAGNOSIS — Z8546 Personal history of malignant neoplasm of prostate: Secondary | ICD-10-CM | POA: Diagnosis not present

## 2024-04-11 DIAGNOSIS — R7309 Other abnormal glucose: Secondary | ICD-10-CM | POA: Diagnosis not present

## 2024-04-11 LAB — BAYER DCA HB A1C WAIVED: HB A1C (BAYER DCA - WAIVED): 5.5 % (ref 4.8–5.6)

## 2024-04-11 MED ORDER — CARIPRAZINE HCL 1.5 MG PO CAPS: 1.5000 mg | ORAL_CAPSULE | Freq: Every day | ORAL | 1 refills | Status: DC
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
## 2024-04-11 NOTE — Pre-Procedure Instructions (Signed)
 Attempted pre-op phone call. Left VM for him to call us back.

## 2024-04-11 NOTE — Telephone Encounter (Signed)
 PA on carelon for EGD update: Order ID: [id number]       Authorized  Approval Valid Through: 04/10/2024 - 06/08/2024

## 2024-04-12 ENCOUNTER — Ambulatory Visit (HOSPITAL_COMMUNITY)
Admission: RE | Admit: 2024-04-12 | Discharge: 2024-04-12 | Disposition: A | Discharge status: Home / Self Care | Attending: Gastroenterology | Admitting: Gastroenterology

## 2024-04-12 ENCOUNTER — Other Ambulatory Visit: Payer: Self-pay

## 2024-04-12 ENCOUNTER — Encounter (HOSPITAL_COMMUNITY): Payer: Self-pay | Admitting: Gastroenterology

## 2024-04-12 ENCOUNTER — Encounter (HOSPITAL_COMMUNITY)
Admission: RE | Disposition: A | Discharge status: Home / Self Care | Payer: Self-pay | Source: Home / Self Care | Attending: Gastroenterology

## 2024-04-12 ENCOUNTER — Ambulatory Visit (HOSPITAL_COMMUNITY): Admitting: Anesthesiology

## 2024-04-12 ENCOUNTER — Other Ambulatory Visit

## 2024-04-12 DIAGNOSIS — K2970 Gastritis, unspecified, without bleeding: Secondary | ICD-10-CM | POA: Diagnosis not present

## 2024-04-12 DIAGNOSIS — K5150 Left sided colitis without complications: Secondary | ICD-10-CM | POA: Diagnosis not present

## 2024-04-12 DIAGNOSIS — I10 Essential (primary) hypertension: Secondary | ICD-10-CM | POA: Insufficient documentation

## 2024-04-12 DIAGNOSIS — F32A Depression, unspecified: Secondary | ICD-10-CM | POA: Diagnosis not present

## 2024-04-12 DIAGNOSIS — F17200 Nicotine dependence, unspecified, uncomplicated: Secondary | ICD-10-CM | POA: Diagnosis not present

## 2024-04-12 DIAGNOSIS — Z923 Personal history of irradiation: Secondary | ICD-10-CM | POA: Diagnosis not present

## 2024-04-12 DIAGNOSIS — G4730 Sleep apnea, unspecified: Secondary | ICD-10-CM | POA: Diagnosis not present

## 2024-04-12 DIAGNOSIS — R630 Anorexia: Secondary | ICD-10-CM | POA: Diagnosis not present

## 2024-04-12 DIAGNOSIS — Z6831 Body mass index (BMI) 31.0-31.9, adult: Secondary | ICD-10-CM | POA: Insufficient documentation

## 2024-04-12 DIAGNOSIS — R634 Abnormal weight loss: Secondary | ICD-10-CM | POA: Diagnosis not present

## 2024-04-12 DIAGNOSIS — R6881 Early satiety: Secondary | ICD-10-CM | POA: Diagnosis present

## 2024-04-12 DIAGNOSIS — Z7952 Long term (current) use of systemic steroids: Secondary | ICD-10-CM | POA: Insufficient documentation

## 2024-04-12 DIAGNOSIS — F419 Anxiety disorder, unspecified: Secondary | ICD-10-CM | POA: Insufficient documentation

## 2024-04-12 DIAGNOSIS — K3189 Other diseases of stomach and duodenum: Secondary | ICD-10-CM | POA: Diagnosis not present

## 2024-04-12 DIAGNOSIS — K449 Diaphragmatic hernia without obstruction or gangrene: Secondary | ICD-10-CM | POA: Insufficient documentation

## 2024-04-12 DIAGNOSIS — Z8546 Personal history of malignant neoplasm of prostate: Secondary | ICD-10-CM | POA: Insufficient documentation

## 2024-04-12 DIAGNOSIS — K51011 Ulcerative (chronic) pancolitis with rectal bleeding: Secondary | ICD-10-CM | POA: Diagnosis not present

## 2024-04-12 DIAGNOSIS — K219 Gastro-esophageal reflux disease without esophagitis: Secondary | ICD-10-CM

## 2024-04-12 HISTORY — DX: Sleep apnea, unspecified: G47.30

## 2024-04-12 HISTORY — PX: ESOPHAGOGASTRODUODENOSCOPY: SHX5428

## 2024-04-12 LAB — PSA: Prostate Specific Ag, Serum: 3 ng/mL (ref 0.0–4.0)

## 2024-04-12 LAB — MICROSCOPIC EXAMINATION
Bacteria, UA: NONE SEEN
Epithelial Cells (non renal): NONE SEEN /HPF (ref 0–10)
RBC, Urine: NONE SEEN /HPF (ref 0–2)
Renal Epithel, UA: NONE SEEN /HPF
Yeast, UA: NONE SEEN

## 2024-04-12 LAB — URINALYSIS, ROUTINE W REFLEX MICROSCOPIC
Bilirubin, UA: NEGATIVE
Glucose, UA: NEGATIVE
Leukocytes,UA: NEGATIVE
Nitrite, UA: NEGATIVE
RBC, UA: NEGATIVE
Specific Gravity, UA: 1.01 (ref 1.005–1.030)
Urobilinogen, Ur: 0.2 mg/dL (ref 0.2–1.0)
pH, UA: >9 — ABNORMAL HIGH (ref 5.0–7.5)

## 2024-04-12 LAB — TSH+FREE T4
Free T4: 1.33 ng/dL (ref 0.82–1.77)
TSH: 0.682 u[IU]/mL (ref 0.450–4.500)

## 2024-04-12 LAB — LIPASE: Lipase: 16 U/L (ref 13–78)

## 2024-04-12 SURGERY — EGD (ESOPHAGOGASTRODUODENOSCOPY)
Anesthesia: General

## 2024-04-12 MED ORDER — PROPOFOL 10 MG/ML IV BOLUS
INTRAVENOUS | Status: DC | PRN
  Administered 2024-04-12: 125 ug/kg/min via INTRAVENOUS
  Administered 2024-04-12: 50 mg via INTRAVENOUS
  Administered 2024-04-12: 100 mg via INTRAVENOUS

## 2024-04-12 MED ORDER — PANTOPRAZOLE SODIUM 40 MG PO TBEC: 40.0000 mg | DELAYED_RELEASE_TABLET | Freq: Every day | ORAL | 1 refills | Status: AC

## 2024-04-12 MED ORDER — LIDOCAINE 2% (20 MG/ML) 5 ML SYRINGE
INTRAMUSCULAR | Status: DC | PRN
  Administered 2024-04-12: 60 mg via INTRAVENOUS

## 2024-04-12 MED ORDER — LACTATED RINGERS IV SOLN: INTRAVENOUS | Status: DC

## 2024-04-12 NOTE — Transfer of Care (Addendum)
 Immediate Anesthesia Transfer of Care Note  Patient: Marolyn JONELLE Galeazzi  Procedure(s) Performed: EGD (ESOPHAGOGASTRODUODENOSCOPY)  Patient Location: Short Stay  Anesthesia Type:General  Level of Consciousness: drowsy and patient cooperative  Airway & Oxygen Therapy: Patient Spontanous Breathing  Post-op Assessment: Report given to RN and Post -op Vital signs reviewed and stable  Post vital signs: Reviewed and stable  Last Vitals:  Vitals Value Taken Time  BP 108/61 04/12/24   0846  Temp 36.5 04/12/24   0846  Pulse 89 04/12/24   0846  Resp 21 04/12/24   0846  SpO2 97% 04/12/24   0846    Last Pain:  Vitals:   04/12/24 0821  TempSrc:   PainSc: 0-No pain         Complications: No notable events documented.

## 2024-04-12 NOTE — Anesthesia Procedure Notes (Signed)
 Date/Time: 04/12/2024 8:25 AM  Performed by: Para Jerelene CROME, CRNAOxygen Delivery Method: Nasal cannula Comments: OptiFlow Nasal Cannula.

## 2024-04-12 NOTE — Interval H&P Note (Signed)
 History and Physical Interval Note:  04/12/2024 7:45 AM  Francisco [name]  has presented today for surgery, with the diagnosis of loss of appetite, weight loss.  The various methods of treatment have been discussed with the patient and family. After consideration of risks, benefits and other options for treatment, the patient has consented to  Procedure(s) with comments: EGD (ESOPHAGOGASTRODUODENOSCOPY) (N/A) - 8:00am, ASA 1-2 as a surgical intervention.  The patient's history has been reviewed, patient examined, no change in status, stable for surgery.  I have reviewed the patient's chart and labs.  Questions were answered to the patient's satisfaction.     Deatrice FALCON Jazira Maloney

## 2024-04-12 NOTE — Op Note (Signed)
 [facility] Patient Name: Francisco [name] Procedure Date: 04/12/2024 8:08 AM MRN: [id number] Date of Birth: Dec 23, 1970 Attending MD: Deatrice Dine , MD, [id number] CSN: [id number] Age: 53 Admit Type: Outpatient Procedure:                Upper GI endoscopy Indications:              Early satiety, Weight loss Providers:                Deatrice Dine, MD, Harlene Lips, Rosina Sprague Referring MD:              Medicines:                Monitored Anesthesia Care Complications:            No immediate complications. Estimated Blood Loss:     Estimated blood loss was minimal. Procedure:                Pre-Anesthesia Assessment:                           - Prior to the procedure, a History and Physical                            was performed, and patient medications and                            allergies were reviewed. The patient's tolerance of                            previous anesthesia was also reviewed. The risks                            and benefits of the procedure and the sedation                            options and risks were discussed with the patient.                            All questions were answered, and informed consent                            was obtained. Prior Anticoagulants: The patient has                            taken no anticoagulant or antiplatelet agents. ASA                            Grade Assessment: II - A patient with mild systemic                            disease. After reviewing the risks and benefits,                            the patient was deemed in satisfactory condition to  undergo the procedure.                           After obtaining informed consent, the endoscope was                            passed under direct vision. Throughout the                            procedure, the patient's blood pressure, pulse, and                            oxygen saturations were monitored continuously. The                             [id number] ([id number]) scope was introduced through                            the mouth, and advanced to the second part of                            duodenum. The upper GI endoscopy was accomplished                            without difficulty. The patient tolerated the                            procedure well. Scope In: 8:29:05 AM Scope Out: 8:42:12 AM Total Procedure Duration: 0 hours 13 minutes 7 seconds  Findings:      The examined esophagus was normal.      A 2 cm hiatal hernia was present.      Diffuse moderate inflammation characterized by erosions was found in the       entire examined stomach. Biopsies were taken with a cold forceps for       histology.      One 15 mm papule (nodule) with no bleeding and no stigmata of recent       bleeding was found on the greater curvature of the gastric antrum. The       nodule was Paris classification IIa (superficial, elevated). Biopsies       were taken with a cold forceps for histology.      Mildly scalloped mucosa was found in the second portion of the duodenum.       Biopsies for histology were taken with a cold forceps for evaluation of       celiac disease. Impression:               - Normal esophagus.                           - 2 cm hiatal hernia.                           - Gastritis. Biopsied.                           - One papule (nodule) found in the  stomach.                            Biopsied.                           - Scalloped mucosa was found in the duodenum.                            Biopsied. Moderate Sedation:      Per Anesthesia Care Recommendation:           - Patient has a contact number available for                            emergencies. The signs and symptoms of potential                            delayed complications were discussed with the                            patient. Return to normal activities tomorrow.                            Written discharge instructions were  provided to the                            patient.                           - Resume previous diet.                           - Continue present medications.                           - Await pathology results.                           - Repeat upper endoscopy for surveillance based on                            pathology results.                           - Return to GI clinic as previously scheduled.                           -Depending on biopsies may recommend MRI Abdomen                            for unintentional weight loss and EUS for gastric                            antrum nodule Procedure Code(s):        --- Professional ---  56760, Esophagogastroduodenoscopy, flexible,                            transoral; with biopsy, single or multiple Diagnosis Code(s):        --- Professional ---                           K44.9, Diaphragmatic hernia without obstruction or                            gangrene                           K29.70, Gastritis, unspecified, without bleeding                           K31.89, Other diseases of stomach and duodenum                           R68.81, Early satiety                           R63.4, Abnormal weight loss CPT copyright 2022 American Medical Association. All rights reserved. The codes documented in this report are preliminary and upon coder review may  be revised to meet current compliance requirements. Deatrice Dine, MD Deatrice Dine, MD 04/12/2024 8:51:04 AM This report has been signed electronically. Number of Addenda: 0

## 2024-04-12 NOTE — Anesthesia Preprocedure Evaluation (Signed)
 Anesthesia Evaluation  Patient identified by MRN, date of birth, ID band Patient awake    Reviewed: Allergy & Precautions, H&P , NPO status , Patient's Chart, lab work & pertinent test results, reviewed documented beta blocker date and time   Airway Mallampati: II  TM Distance: >3 FB Neck ROM: full    Dental no notable dental hx.    Pulmonary neg pulmonary ROS, sleep apnea , Current Smoker and Patient abstained from smoking.   Pulmonary exam normal breath sounds clear to auscultation       Cardiovascular Exercise Tolerance: Good hypertension, negative cardio ROS  Rhythm:regular Rate:Normal     Neuro/Psych  Headaches PSYCHIATRIC DISORDERS Anxiety Depression    negative neurological ROS  negative psych ROS   GI/Hepatic negative GI ROS, Neg liver ROS, PUD,GERD  ,,  Endo/Other  negative endocrine ROS    Renal/GU negative Renal ROS  negative genitourinary   Musculoskeletal   Abdominal   Peds  Hematology negative hematology ROS (+)   Anesthesia Other Findings   Reproductive/Obstetrics negative OB ROS                              Anesthesia Physical Anesthesia Plan  ASA: 3  Anesthesia Plan: General   Post-op Pain Management:    Induction:   PONV Risk Score and Plan: Propofol  infusion  Airway Management Planned:   Additional Equipment:   Intra-op Plan:   Post-operative Plan:   Informed Consent: I have reviewed the patients History and Physical, chart, labs and discussed the procedure including the risks, benefits and alternatives for the proposed anesthesia with the patient or authorized representative who has indicated his/her understanding and acceptance.     Dental Advisory Given  Plan Discussed with: CRNA  Anesthesia Plan Comments:         Anesthesia Quick Evaluation

## 2024-04-13 ENCOUNTER — Encounter (HOSPITAL_COMMUNITY): Payer: Self-pay | Admitting: Gastroenterology

## 2024-04-13 LAB — SURGICAL PATHOLOGY

## 2024-04-13 NOTE — Anesthesia Postprocedure Evaluation (Signed)
 Anesthesia Post Note  Patient: Francisco [name]  Procedure(s) Performed: EGD (ESOPHAGOGASTRODUODENOSCOPY)  Patient location during evaluation: Phase II Anesthesia Type: General Level of consciousness: awake Pain management: pain level controlled Vital Signs Assessment: post-procedure vital signs reviewed and stable Respiratory status: spontaneous breathing and respiratory function stable Cardiovascular status: blood pressure returned to baseline and stable Postop Assessment: no headache and no apparent nausea or vomiting Anesthetic complications: no Comments: Late entry   No notable events documented.   Last Vitals:  Vitals:   04/12/24 0846 04/12/24 0902  BP: 108/61 111/64  Pulse: 90   Resp: (!) 21   Temp: 36.5 C   SpO2: 97%     Last Pain:  Vitals:   04/12/24 0846  TempSrc: Oral  PainSc: Asleep                 [name]

## 2024-04-16 ENCOUNTER — Telehealth (INDEPENDENT_AMBULATORY_CARE_PROVIDER_SITE_OTHER): Payer: Self-pay | Admitting: Gastroenterology

## 2024-04-16 ENCOUNTER — Other Ambulatory Visit (INDEPENDENT_AMBULATORY_CARE_PROVIDER_SITE_OTHER): Payer: Self-pay

## 2024-04-16 ENCOUNTER — Encounter (INDEPENDENT_AMBULATORY_CARE_PROVIDER_SITE_OTHER): Payer: Self-pay | Admitting: Gastroenterology

## 2024-04-16 ENCOUNTER — Encounter (INDEPENDENT_AMBULATORY_CARE_PROVIDER_SITE_OTHER): Payer: Self-pay

## 2024-04-16 DIAGNOSIS — Z8546 Personal history of malignant neoplasm of prostate: Secondary | ICD-10-CM

## 2024-04-16 DIAGNOSIS — K921 Melena: Secondary | ICD-10-CM

## 2024-04-16 DIAGNOSIS — R6881 Early satiety: Secondary | ICD-10-CM

## 2024-04-16 DIAGNOSIS — R634 Abnormal weight loss: Secondary | ICD-10-CM

## 2024-04-16 DIAGNOSIS — K6389 Other specified diseases of intestine: Secondary | ICD-10-CM

## 2024-04-16 LAB — CBC
Hematocrit: 42 % (ref 37.5–51.0)
Hemoglobin: 13.6 g/dL (ref 13.0–17.7)
MCH: 30.1 pg (ref 26.6–33.0)
MCHC: 32.4 g/dL (ref 31.5–35.7)
MCV: 93 fL (ref 79–97)
Platelets: 333 x10E3/uL (ref 150–450)
RBC: 4.52 x10E6/uL (ref 4.14–5.80)
RDW: 14.1 % (ref 11.6–15.4)
WBC: 6.4 x10E3/uL (ref 3.4–10.8)

## 2024-04-16 LAB — COMPREHENSIVE METABOLIC PANEL WITH GFR
ALT: 18 IU/L (ref 0–44)
AST: 15 IU/L (ref 0–40)
Albumin: 4.2 g/dL (ref 3.8–4.9)
Alkaline Phosphatase: 109 IU/L (ref 47–123)
BUN/Creatinine Ratio: 9 (ref 9–20)
BUN: 7 mg/dL (ref 6–24)
Bilirubin Total: 0.5 mg/dL (ref 0.0–1.2)
CO2: 20 mmol/L (ref 20–29)
Calcium: 9.4 mg/dL (ref 8.7–10.2)
Chloride: 103 mmol/L (ref 96–106)
Creatinine, Ser: 0.76 mg/dL (ref 0.76–1.27)
Globulin, Total: 1.8 g/dL (ref 1.5–4.5)
Glucose: 121 mg/dL — ABNORMAL HIGH (ref 70–99)
Potassium: 3.6 mmol/L (ref 3.5–5.2)
Sodium: 139 mmol/L (ref 134–144)
Total Protein: 6 g/dL (ref 6.0–8.5)
eGFR: 109 mL/min/1.73 (ref >59)

## 2024-04-16 LAB — QUANTIFERON-TB GOLD PLUS
QuantiFERON Mitogen Value: >10 [IU]/mL
QuantiFERON Nil Value: 0.05 [IU]/mL
QuantiFERON TB1 Ag Value: 0.05 [IU]/mL
QuantiFERON TB2 Ag Value: 0.05 [IU]/mL

## 2024-04-16 LAB — FERRITIN: Ferritin: 140 ng/mL (ref 30–400)

## 2024-04-16 LAB — C-REACTIVE PROTEIN: CRP: 3 mg/L (ref 0–10)

## 2024-04-16 LAB — HEPATITIS B CORE ANTIBODY, TOTAL: Hep B Core Total Ab: NEGATIVE

## 2024-04-16 LAB — HEPATITIS C ANTIBODY: Hep C Virus Ab: NONREACTIVE

## 2024-04-16 LAB — HIV ANTIBODY (ROUTINE TESTING W REFLEX): HIV Screen 4th Generation wRfx: NONREACTIVE

## 2024-04-16 LAB — CALPROTECTIN, FECAL: Calprotectin, Fecal: 20 ug/g (ref 0–120)

## 2024-04-16 LAB — HEPATITIS B SURFACE ANTIGEN: Hepatitis B Surface Ag: NEGATIVE

## 2024-04-16 LAB — HEPATITIS B SURFACE ANTIBODY,QUALITATIVE: Hep B Surface Ab, Qual: NONREACTIVE

## 2024-04-16 LAB — VITAMIN B12: Vitamin B-12: 976 pg/mL (ref 232–1245)

## 2024-04-16 NOTE — Telephone Encounter (Signed)
 Pt sent my chart message:  I am still unable to eat without being full immediately still lots of pain in my stomach no energy looking to see if you could schedule the MRI. I am unable to work because I can't even get off the couch. Thank you.

## 2024-04-16 NOTE — Telephone Encounter (Signed)
 PA for MRI Abd and MRI pelvis on [facility]: Order ID: [id number]       Authorized  Approval Valid Through: 04/16/2024 - 07/14/2024

## 2024-04-16 NOTE — Telephone Encounter (Signed)
 Hi Mindy,   Can you please schedule a MRI ABDOMEN AND PELVIS ? Dx: weight loss , loss of appetite , history of prostate cancer , ulcerative colitis.  Thanks,  Armon Orvis Faizan Deyana Wnuk, MD Gastroenterology and Hepatology [facility]

## 2024-04-16 NOTE — Telephone Encounter (Signed)
 Patient's wife, Farrel, called to say patient was weak, not any better since procedure on Thursday last week. [phone]

## 2024-04-16 NOTE — Telephone Encounter (Signed)
 Mychart message sent to pt.

## 2024-04-16 NOTE — Telephone Encounter (Signed)
 Left message for wife to return call

## 2024-04-16 NOTE — Telephone Encounter (Signed)
 Called and spoke with pt's wife, gave her MR Abd and Pelvis appt for 11/01 at 2pm (NPO 4 hours). Patient's wife aware and verbalized understanding.

## 2024-04-17 ENCOUNTER — Ambulatory Visit (INDEPENDENT_AMBULATORY_CARE_PROVIDER_SITE_OTHER): Payer: Self-pay | Admitting: Gastroenterology

## 2024-04-18 NOTE — Progress Notes (Signed)
 Patient result letter mailed Patient's PCP is on EPIC

## 2024-04-21 ENCOUNTER — Ambulatory Visit (HOSPITAL_COMMUNITY)
Admission: RE | Admit: 2024-04-21 | Discharge: 2024-04-21 | Disposition: A | Discharge status: Home / Self Care | Source: Ambulatory Visit | Attending: Gastroenterology | Admitting: Gastroenterology

## 2024-04-21 ENCOUNTER — Encounter (HOSPITAL_COMMUNITY): Payer: Self-pay

## 2024-04-21 DIAGNOSIS — K6389 Other specified diseases of intestine: Secondary | ICD-10-CM | POA: Diagnosis not present

## 2024-04-21 DIAGNOSIS — R630 Anorexia: Secondary | ICD-10-CM | POA: Diagnosis not present

## 2024-04-21 DIAGNOSIS — K921 Melena: Secondary | ICD-10-CM | POA: Insufficient documentation

## 2024-04-21 DIAGNOSIS — R6881 Early satiety: Secondary | ICD-10-CM | POA: Insufficient documentation

## 2024-04-21 DIAGNOSIS — K5190 Ulcerative colitis, unspecified, without complications: Secondary | ICD-10-CM | POA: Diagnosis not present

## 2024-04-21 DIAGNOSIS — R634 Abnormal weight loss: Secondary | ICD-10-CM | POA: Insufficient documentation

## 2024-04-21 DIAGNOSIS — Z8546 Personal history of malignant neoplasm of prostate: Secondary | ICD-10-CM

## 2024-04-21 DIAGNOSIS — K7689 Other specified diseases of liver: Secondary | ICD-10-CM | POA: Diagnosis not present

## 2024-04-21 MED ORDER — GADOBUTROL 1 MMOL/ML IV SOLN
10.0000 mL | Freq: Once | INTRAVENOUS | Status: AC | PRN
  Administered 2024-04-21: 10 mL via INTRAVENOUS

## 2024-04-23 ENCOUNTER — Ambulatory Visit (INDEPENDENT_AMBULATORY_CARE_PROVIDER_SITE_OTHER): Payer: Self-pay | Admitting: Gastroenterology

## 2024-04-23 NOTE — Progress Notes (Signed)
 Hi Francisco [name] ,  Can you please call the patient and tell the patient the MRI did not show any evidence of malignancy/tumor or anything else which is concerning . This is all good news  Continue to follow up with your PCP and with us  in the GI clinic given your symptoms   Thanks,  Desirea Mizrahi [name], MD Gastroenterology and Hepatology [facility] Gastroenterology ===============  MRI Abdomen and Pelvis   IMPRESSION: 1. No acute inflammatory process identified within the abdomen or pelvis. 2. No metastatic disease identified within the abdomen or pelvis. 3. Multiple other nonacute observations, as described above.

## 2024-04-24 ENCOUNTER — Encounter: Payer: Self-pay | Admitting: Family Medicine

## 2024-04-25 NOTE — Telephone Encounter (Signed)
 Glad to complete a note but he needs to be seen in office so that we have documentation of current symptoms etc. to write him out for his short-term disability.  They are going to want details of why he still needs to be out since he has had a negative workup so far

## 2024-04-25 NOTE — Telephone Encounter (Signed)
 Yes SD slot is fine

## 2024-04-26 NOTE — Telephone Encounter (Signed)
Appointment scheduled per patient request

## 2024-04-27 ENCOUNTER — Encounter: Payer: Self-pay | Admitting: Family Medicine

## 2024-04-27 ENCOUNTER — Ambulatory Visit: Admitting: Family Medicine

## 2024-04-27 VITALS — BP 116/73 | HR 85 | Temp 97.5°F | Ht 69.0 in | Wt 220.4 lb

## 2024-04-27 DIAGNOSIS — M545 Low back pain, unspecified: Secondary | ICD-10-CM

## 2024-04-27 DIAGNOSIS — F332 Major depressive disorder, recurrent severe without psychotic features: Secondary | ICD-10-CM | POA: Diagnosis not present

## 2024-04-27 DIAGNOSIS — M47896 Other spondylosis, lumbar region: Secondary | ICD-10-CM | POA: Diagnosis not present

## 2024-04-27 DIAGNOSIS — R197 Diarrhea, unspecified: Secondary | ICD-10-CM | POA: Diagnosis not present

## 2024-04-27 DIAGNOSIS — G8929 Other chronic pain: Secondary | ICD-10-CM
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
## 2024-04-27 NOTE — Patient Instructions (Signed)
 Alpha-gal Syndrome Alpha-gal syndrome (AGS) is an allergic reaction to a type of sugar commonly called alpha-gal. It is found in the meat and organ meats of mammals, such as cows, pigs, and sheep. It may also be found in products that come from animals, such as gelatin, medicines, medicine capsules, some milk products, vaccines, and cosmetics. AGS causes an allergic reaction that can be immediate or delayed for several hours and can range from mild to severe. A mild reaction may cause nausea, vomiting, or an itchy rash (hives). A severe reaction can cause breathing difficulties or loss of consciousness (anaphylaxis). This can be life-threatening. What are the causes? This allergy is first triggered by a tick bite from a lone star or blackleg tick. These ticks bite animals, such as cows, pigs, or sheep, and pick up the alpha-gal sugar from their blood. If the same tick bites you, it may cause your body's defense system (immune system) to produce antibodies to alpha-gal and cause the allergic reaction. What increases the risk? People who live in areas of the [place] where the lone star tick is common are at highest risk, these areas may include: Southeastern. [place]. [place] into parts of [place]. People who are hunters and people who work in jobs that involve caring for trees (foresters) have an increased risk of this condition. What are the signs or symptoms? AGS may not cause an allergic reaction every time you eat red meat or come into contact with alpha-gal. If you do have a reaction, symptoms may include: Hives. Severe stomachache. Nausea or vomiting. Swelling of the lips, face, tongue, or throat. Making a high-pitched whistling sound when you breathe, most often when you breathe out (wheezing). Sneezing and runny nose. Headache. Symptoms of anaphylaxis may include: Difficulty breathing. Difficulty swallowing. Dizziness. Fainting. This may happen due to a sudden drop in  blood pressure. You may have AGS if you had anaphylaxis after eating something but do not have any known food allergies. Unlike other food allergies, the reaction does not start soon after the exposure to alpha-gal. There may be a delay of several hours. How is this diagnosed? This condition may be diagnosed based on signs and symptoms of the condition, especially if you have a history of tick bites and a delayed reaction to red meat. You may also have a blood test to check for antibodies to alpha-gal or a skin test to see if there is a reaction to alpha-gal. How is this treated? This condition may be treated by: Avoiding meat and organ meats that may contain alpha-gal. Avoiding medicines or other products that may contain alpha-gal. Using medicines to reduce an allergic reaction. Carrying an epinephrine auto-injector to use in case of a severe AGS reaction. AGS should be treated by an allergist or a health care provider who has experience with AGS. Follow these instructions at home:  Medicines Take over-the-counter and prescription medicines only as told by your health care provider. Follow instructions from your health care provider about when and how to use an epinephrine auto-injector. General instructions Avoid red meat and organ meat, and check food labels for meat-based ingredients in packaged foods such as soup, gravy, and flavoring. Work with your allergist to find what other foods or products you may need to avoid, some people may benefit from avoiding dairy. Keep all follow-up visits. This is important. How is this prevented? Take steps to prevent tick bites as frequent tick bites may increase the risk of an AGS reaction. These steps  include: Avoiding woods and fields with high grass. Wearing clothing protected with the anti-tick chemical permethrin when outdoors in these areas or using a U.S. Environmental Protection Agency-approved insect repellent. Checking your clothing for  ticks before you come back indoors. Checking your body for ticks when you take a shower. Checking your pets for ticks. Where to find more information Centers for Disease Control and Prevention: FootballExhibition.com.br [employer] of Allergy and Infectious Diseases: https://hahn.com/ Contact a health care provider if: You have any signs or symptoms of AGS food allergy. You have a tick bite that causes a skin reaction. Get help right away if: You have a severe AGS reaction. You have trouble swallowing or breathing. These symptoms may represent a serious problem that is an emergency. Do not wait to see if the symptoms will go away. Get medical help right away. Call your local emergency services (911 in the [place] Do not drive yourself to the hospital. Summary AGS is a food allergy caused by a tick bite. Red meats, organ meats, and other products or medicines may trigger the alpha-gal reaction. AGS reactions can be mild or severe. If you have AGS, you should not eat red meat, organ meat, or products that may contain alpha-gal. Avoiding tick bites prevents AGS and more serious AGS reactions. This information is not intended to replace advice given to you by your health care provider. Make sure you discuss any questions you have with your health care provider. Document Revised: 09/23/2020 Document Reviewed: 09/23/2020 Elsevier Patient Education  2024 [employer].

## 2024-04-29 DIAGNOSIS — G4733 Obstructive sleep apnea (adult) (pediatric): Secondary | ICD-10-CM | POA: Diagnosis not present

## 2024-05-01 ENCOUNTER — Ambulatory Visit: Payer: Self-pay | Admitting: Family Medicine

## 2024-05-01 LAB — ALPHA-GAL PANEL
Allergen Lamb IgE: <0.1 kU/L
Beef IgE: <0.1 kU/L
IgE (Immunoglobulin E), Serum: 41 [IU]/mL (ref 6–495)
O215-IgE Alpha-Gal: 0.25 kU/L — AB
Pork IgE: <0.1 kU/L

## 2024-05-14 ENCOUNTER — Ambulatory Visit: Payer: Self-pay | Admitting: Family Medicine

## 2024-05-14 ENCOUNTER — Encounter: Payer: Self-pay | Admitting: Family Medicine

## 2024-05-14 VITALS — BP 103/63 | HR 75 | Temp 98.1°F | Ht 69.0 in | Wt 220.2 lb

## 2024-05-14 DIAGNOSIS — I951 Orthostatic hypotension: Secondary | ICD-10-CM

## 2024-05-14 DIAGNOSIS — F332 Major depressive disorder, recurrent severe without psychotic features: Secondary | ICD-10-CM | POA: Diagnosis not present

## 2024-05-14 MED ORDER — CARIPRAZINE HCL 3 MG PO CAPS: 3.0000 mg | ORAL_CAPSULE | Freq: Every day | ORAL | 1 refills | Status: DC
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
## 2024-05-15 ENCOUNTER — Institutional Professional Consult (permissible substitution): Admitting: Professional Counselor

## 2024-05-22 ENCOUNTER — Ambulatory Visit: Payer: Self-pay

## 2024-05-22 ENCOUNTER — Ambulatory Visit: Admitting: Professional Counselor

## 2024-05-22 DIAGNOSIS — F332 Major depressive disorder, recurrent severe without psychotic features: Secondary | ICD-10-CM
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
## 2024-05-22 NOTE — Telephone Encounter (Signed)
 FYI Only or Action Required?: FYI only for provider: [facility] helping patient get rescheduled.  Patient was last seen in primary care on 05/14/2024 by Jolinda Norene HERO, DO.  Called Nurse Triage reporting Panic Attack.  Symptoms began today.  Interventions attempted: Prescription medications: Hydroxyzine .  Symptoms are: unchanged.  Triage Disposition: See PCP When Office is Open (Within 3 Days)  Patient/caregiver understands and will follow disposition?: Yes  Copied from CRM [id number]. Topic: Clinical - Red Word Triage >> May 22, 2024  1:25 PM Myrick T wrote: [facility] that prompted transfer to Nurse Triage: patients spouse called stated patient is having a panic attack Reason for Disposition  MODERATE anxiety (e.g., persistent or frequent anxiety symptoms; interferes with sleep, school, or work)  Answer Assessment - Initial Assessment Questions 1. CONCERN: Did anything happen that prompted you to call today?      Wife is on the phone. Patient was suppose to have appointment at 2 today with Dr Reida. I attempt to cancel appointment and reschedule but could not find appointments. I called [facility] and they said they would handle patient's concerns. Connected patient's wife and [facility]. Reports increase of Valar recently. 2. ANXIETY SYMPTOMS: Can you describe how you (your loved one; patient) have been feeling? (e.g., tense, restless, panicky, anxious, keyed up, overwhelmed, sense of impending doom).      Panicked, anxious 3. ONSET: How long have you been feeling this way? (e.g., hours, days, weeks)     Has been like this the last week 4. SEVERITY: How would you rate the level of anxiety? (e.g., 0 - 10; or mild, moderate, severe).     severe 5. FUNCTIONAL IMPAIRMENT: How have these feelings affected your ability to do daily activities? Have you had more difficulty than usual doing your normal daily activities? (e.g., getting better, same, worse; self-care, school, work, curator)      Trouble doing daily activities, no motivation, sleeping a lot. 6. HISTORY: Have you felt this way before? Have you ever been diagnosed with an anxiety problem in the past? (e.g., generalized anxiety disorder, panic attacks, PTSD). If Yes, ask: How was this problem treated? (e.g., medicines, counseling, etc.)     Panic disorder  8. TREATMENT:  What has been done so far to treat this anxiety? (e.g., medicines, relaxation strategies). What has helped?     Hydroxizine, 9. THERAPIST: Do you have a counselor or therapist? If Yes, ask: What is their name?     Denies  Protocols used: Anxiety and Panic Attack-A-AH

## 2024-05-25 ENCOUNTER — Telehealth: Payer: Self-pay | Admitting: Urology

## 2024-05-25 NOTE — Patient Instructions (Signed)
 If your symptoms worsen or you have thoughts of suicide/homicide, PLEASE SEEK IMMEDIATE MEDICAL ATTENTION.  You may always call:   National Suicide Hotline: 988 or 539 667 3577 [place] Crisis Line: 458 569 7915 Crisis Recovery in [place]: [phone]     These are available 24 hours a day, 7 days a week.

## 2024-05-25 NOTE — Telephone Encounter (Signed)
 The patient called to request a medication refill of Cialis . Patient would like the medication sent to [facility].  Scheduled next available appt

## 2024-05-28 ENCOUNTER — Telehealth (INDEPENDENT_AMBULATORY_CARE_PROVIDER_SITE_OTHER): Payer: Self-pay | Admitting: Professional Counselor

## 2024-05-28 ENCOUNTER — Encounter: Admitting: Professional Counselor

## 2024-05-28 DIAGNOSIS — F332 Major depressive disorder, recurrent severe without psychotic features: Secondary | ICD-10-CM

## 2024-05-28 NOTE — BH Specialist Note (Unsigned)
 [facility] Treatment plan note  MRN: [id number] NAME: Francisco [name] Date: 06/07/2024  Start time: Start Time: 0335 End time: Stop Time: 0410 Total time: Total Time in Minutes (Visit): 35 Call number: Visit Number: 2- Second Visit  Total number of Virtual BH Treatment Team Plan encounters: 1/4   Treatment Team Attendees: Carter Becker and Francisco [name]    Attestation signed by Warren Becker, PMHNP, DNP 05/28/2024 3:37 PM   Collaborative Care Psychiatric Consultant Case Review   Assessment/Provisional Diagnosis 53 year old male with history of prostate cancer, migraine, neck issues, proctosigmoiditis, and sleep sleep apnea.  The patient is referred for anxiety and depression, withdrawing from Suboxone.   Provisional Diagnosis: # MDD, Recurrent, severe, without psychosis # GAD # Opiate dependency, withdrawing from Suboxone   Recommendation 1. Continue Vraylar  3 mg daily, Decrease Lexapro fro 10 mg daily to %mg, hydroxyzine  50 mg BID PRN anxiety 2. Recommend return to substance abuse facility for Suboxone to prevent relapse and psychiatric services to manage his medications. 3. BH specialist to follow up.   Thank you for your consult. Please contact our collaborative care team for any questions or concerns.   I spent 20 minutes chart reviewing, discussing with [facility] Speicalist and documenting in the chart.    Goals, Interventions and Follow-up Plan Goals: Increase healthy adjustment to current life circumstances Interventions: Behavioral Activation CBT Cognitive Behavioral Therapy Medication Management Recommendations: Decrease Lexapro back to 5mg  Follow-up Plan: Refer to SA treatment   History of the present illness Presenting Problem/Current Symptoms:  The patient is a 53 year old male who presented for a collaborative care assessment. He initially attempted to cancel his appointment due to experiencing a panic attack on his way to the office, stating he couldnt do it. A  virtual session was completed instead. He presented with significant anxiety, reporting restlessness, irritability, internal discomfort described as feeling like his insides are crawling, and symptoms consistent with withdrawal.   The patient reports being off Suboxone for approximately two weeks. He has a long and complex history of substance use and had reached a period of stability while on Suboxone. He began tapering himself off because he did not want to remain on it long-term. Initially he felt stable following discontinuation, but symptoms soon emerged, including cold sweats, gastrointestinal distress, lack of appetite, insomnia, night sweats, constant panic, and chest tightness. He also reports increased anxiety and panic attacks when in public. Psychoeducation was provided regarding post-acute withdrawal syndrome (PAWS), which can occur months after discontinuing opioids and other substances, and can manifest similarly to acute withdrawal symptoms.   Given his substance use history and current level of distress, a consultation with the collaborative care psychiatrist will be completed to determine the safest and most appropriate plan. There is concern that his current withdrawal symptoms and anxiety may place him at elevated risk for relapse, potentially making collaborative care an insufficient level of support. Regardless, the team will conduct the case consultation and continue short-term follow-up to ensure he feels supported and receives appropriate referrals.   In regard to safety, the patient reports fleeting suicidal thoughts related to not wanting to continue feeling this way. He notes a significant history of suicidal ideation and multiple past suicide attempts. He previously had thoughts of using a firearm but asked his wife to take possession of the gun, and she has done so. He currently denies any suicidal thoughts, plan, or intent. His risk is mitigated by his openness today, insight  into his symptoms, and  strong support system, including his wife and daughter.   Emergency resources were reviewed with the patient, and education was provided regarding suicidal ideation, warning signs, and when to seek immediate help. Based on his history of prior suicide attempts, recent fleeting thoughts, and current symptoms of anxiety and depression, he is assessed to be at moderate risk at this time. He was advised to seek emergency care immediately if his symptoms worsen or if he begins experiencing any intent or plan to harm himself. The patient expressed understanding, agreed to the safety recommendations, and reiterated that he has no current intention or plan to harm himself.   The patient is not currently under psychiatric care. He is taking Lexapro 2 mg, hydroxyzine  50 mg PRN, and Vraylar  3 mg, and reports mild improvement but feels he may benefit from more comprehensive treatment. He is open to therapy and medication management and expresses a desire for relief. He reports periods of past success and demonstrates hopefulness.   The plan is to complete psychiatric case consultation, schedule a follow-up appointment, and determine the most appropriate level of care--potentially higher-level substance use treatment or psychiatric management--to ensure his safety and stability.    PHQ-9 Scores:     05/22/2024    2:13 PM 05/14/2024    1:54 PM 04/27/2024   10:14 AM 04/11/2024   12:07 PM 01/03/2024    1:07 PM  Depression screen PHQ 2/9  Decreased Interest 3 3 3 3 1   Down, Depressed, Hopeless 3 3 3 3  0  PHQ - 2 Score 6 6 6 6 1   Altered sleeping 3 3 2 3 2   Tired, decreased energy 3 3 3 3 2   Change in appetite 3 2 3 3 2   Feeling bad or failure about yourself  3 3 3 3  0  Trouble concentrating 3 3 2 3 1   Moving slowly or fidgety/restless 3 3 3 3 1   Suicidal thoughts 3 3 0 3 0  PHQ-9 Score 27 26 22 27  9    Difficult doing work/chores Extremely dIfficult Extremely dIfficult Extremely  dIfficult Extremely dIfficult Very difficult     Data saved with a previous flowsheet row definition   GAD-7 Scores:     05/22/2024    2:25 PM 05/14/2024    1:54 PM 04/27/2024   10:15 AM 04/11/2024   12:08 PM  GAD 7 : Generalized Anxiety Score  Nervous, Anxious, on Edge 3 3 2 3   Control/stop worrying 3 3 3 3   Worry too much - different things 3 3 3 3   Trouble relaxing 3 3 2 3   Restless 3 2 2 3   Easily annoyed or irritable 3 3 2 3   Afraid - awful might happen 3 3 3 3   Total GAD 7 Score 21 20 17 21   Anxiety Difficulty Extremely difficult Extremely difficult Very difficult Extremely difficult    Stress Current stressors:  withdrawal symptoms  Sleep:  poor Appetite:  poor Coping ability:  fair Patient taking medications as prescribed:  yes  Current medications:  Outpatient Encounter Medications as of 05/28/2024  Medication Sig   cariprazine  (VRAYLAR ) 3 MG capsule Take 1 capsule (3 mg total) by mouth daily.   escitalopram (LEXAPRO) 10 MG tablet Take 10 mg by mouth daily.   hydrOXYzine  (ATARAX ) 50 MG tablet Take 50 mg by mouth 2 (two) times daily as needed.   mesalamine  (LIALDA ) 1.2 g EC tablet Take 4 tablets (4.8 g total) by mouth daily with breakfast.   mesalamine  (ROWASA ) 4 g  enema Place 60 mLs (4 g total) rectally at bedtime.   pantoprazole  (PROTONIX ) 40 MG tablet Take 1 tablet (40 mg total) by mouth daily.   tamsulosin (FLOMAX) 0.4 MG CAPS capsule Take 0.4 mg by mouth at bedtime.   No facility-administered encounter medications on file as of 05/28/2024.     Self-harm Behaviors Risk Assessment Self-harm risk factors:  Past SI Patient endorses recent thoughts of harming self:  Denies   Danger to Others Risk Assessment Danger to others risk factors:  Denies  Patient endorses recent thoughts of harming others:  denies  Substance Use Assessment Patient recently consumed alcohol:    Alcohol Use Disorder Identification Test (AUDIT):     01/12/2023   12:59 PM  Alcohol Use  Disorder Test (AUDIT)  1. How often do you have a drink containing alcohol? 0  2. How many drinks containing alcohol do you have on a typical day when you are drinking? 0  3. How often do you have six or more drinks on one occasion? 0  AUDIT-C Score 0   Patient recently used drugs:    Opioid Risk Assessment: Moderate risk   Goals, Interventions and Follow-up Plan Goals: Increase healthy adjustment to current life circumstances Interventions: Medication Monitoring Follow-up Plan: Refer to SA treatment  Summary:   Francisco JINNY [name]

## 2024-05-29 DIAGNOSIS — G4733 Obstructive sleep apnea (adult) (pediatric): Secondary | ICD-10-CM | POA: Diagnosis not present

## 2024-05-29 MED ORDER — TADALAFIL 5 MG PO TABS: 5.0000 mg | ORAL_TABLET | Freq: Every day | ORAL | 11 refills | Status: DC

## 2024-05-29 MED ORDER — TADALAFIL 5 MG PO TABS: 5.0000 mg | ORAL_TABLET | Freq: Every day | ORAL | 11 refills | Status: AC

## 2024-05-29 NOTE — Telephone Encounter (Signed)
 Message left stating medication was sent to pharmacy.

## 2024-06-06 ENCOUNTER — Telehealth: Payer: Self-pay | Admitting: Family Medicine

## 2024-06-06 NOTE — Telephone Encounter (Signed)
 [place]  [employer] faxed Disability forms to be completed  Form Fee Paid? (N)            If NO, form is placed on front office manager desk to hold until payment received. If YES, then form will be placed in the RX/HH Nurse Coordinators box for completion.  Form will not be processed until payment is received  LMTCB to pay form fees and collect info.

## 2024-06-11 ENCOUNTER — Ambulatory Visit (INDEPENDENT_AMBULATORY_CARE_PROVIDER_SITE_OTHER): Admitting: Family Medicine

## 2024-06-11 ENCOUNTER — Encounter: Payer: Self-pay | Admitting: Family Medicine

## 2024-06-11 VITALS — BP 136/84 | HR 104 | Temp 96.8°F | Ht 69.0 in | Wt 215.4 lb

## 2024-06-11 DIAGNOSIS — F332 Major depressive disorder, recurrent severe without psychotic features: Secondary | ICD-10-CM | POA: Diagnosis not present

## 2024-06-11 DIAGNOSIS — Z87898 Personal history of other specified conditions: Secondary | ICD-10-CM | POA: Diagnosis not present
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
## 2024-06-12 ENCOUNTER — Telehealth: Payer: Self-pay | Admitting: Family Medicine

## 2024-06-12 ENCOUNTER — Ambulatory Visit: Admitting: Physical Medicine and Rehabilitation

## 2024-06-12 ENCOUNTER — Encounter: Payer: Self-pay | Admitting: Family Medicine

## 2024-06-12 DIAGNOSIS — Z0279 Encounter for issue of other medical certificate: Secondary | ICD-10-CM

## 2024-06-12 NOTE — Telephone Encounter (Signed)
 [place]  [employer] faxed STD forms to be completed  Form Fee Paid? (Y/N)       YES If NO, form is placed on front office manager desk to hold until payment received. If YES, then form will be placed in the RX/HH Nurse Coordinators box for completion.  Form will not be processed until payment is received

## 2024-06-24 ENCOUNTER — Encounter: Payer: Self-pay | Admitting: Family Medicine

## 2024-06-26 MED ORDER — ESCITALOPRAM OXALATE 10 MG PO TABS: 10.0000 mg | ORAL_TABLET | Freq: Every day | ORAL | 0 refills | Status: AC

## 2024-07-02 ENCOUNTER — Other Ambulatory Visit: Payer: Self-pay | Admitting: Family Medicine

## 2024-07-02 ENCOUNTER — Other Ambulatory Visit (INDEPENDENT_AMBULATORY_CARE_PROVIDER_SITE_OTHER): Payer: Self-pay | Admitting: Gastroenterology

## 2024-07-02 DIAGNOSIS — K51011 Ulcerative (chronic) pancolitis with rectal bleeding: Secondary | ICD-10-CM

## 2024-07-02 DIAGNOSIS — F332 Major depressive disorder, recurrent severe without psychotic features: Secondary | ICD-10-CM

## 2024-07-03 ENCOUNTER — Ambulatory Visit: Admitting: Physical Medicine and Rehabilitation

## 2024-11-21 ENCOUNTER — Ambulatory Visit: Admitting: Urology
